# Patient Record
Sex: Female | Born: 1985 | Race: White | Hispanic: No | Marital: Single | Smoking: Never smoker
Health system: Southern US, Community
[De-identification: ages and names within clinical notes are randomized; demographics above are authoritative.]

## PROBLEM LIST (undated history)

## (undated) DIAGNOSIS — J45909 Unspecified asthma, uncomplicated: Secondary | ICD-10-CM

## (undated) DIAGNOSIS — E119 Type 2 diabetes mellitus without complications: Secondary | ICD-10-CM

## (undated) HISTORY — PX: ANKLE FRACTURE SURGERY: SHX122

---
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
## 2004-03-11 ENCOUNTER — Emergency Department: Payer: Self-pay | Admitting: Emergency Medicine

## 2004-10-18 ENCOUNTER — Emergency Department: Payer: Self-pay | Admitting: Emergency Medicine

## 2004-12-14 ENCOUNTER — Emergency Department (HOSPITAL_COMMUNITY): Admission: EM | Admit: 2004-12-14 | Discharge: 2004-12-14 | Payer: Self-pay | Admitting: Emergency Medicine

## 2005-03-26 ENCOUNTER — Emergency Department: Payer: Self-pay | Admitting: General Practice

## 2005-08-08 ENCOUNTER — Emergency Department: Payer: Self-pay | Admitting: Emergency Medicine

## 2005-08-09 ENCOUNTER — Ambulatory Visit: Payer: Self-pay | Admitting: Emergency Medicine

## 2005-12-25 ENCOUNTER — Observation Stay: Payer: Self-pay | Admitting: Unknown Physician Specialty

## 2005-12-29 ENCOUNTER — Observation Stay (HOSPITAL_COMMUNITY): Admission: RE | Admit: 2005-12-29 | Discharge: 2005-12-30 | Payer: Self-pay | Admitting: Obstetrics & Gynecology

## 2006-03-08 ENCOUNTER — Other Ambulatory Visit: Payer: Self-pay

## 2006-03-08 ENCOUNTER — Emergency Department: Payer: Self-pay | Admitting: Emergency Medicine

## 2006-07-14 ENCOUNTER — Emergency Department: Payer: Self-pay | Admitting: Emergency Medicine

## 2006-11-01 ENCOUNTER — Emergency Department: Payer: Self-pay | Admitting: Emergency Medicine

## 2007-12-28 ENCOUNTER — Observation Stay: Payer: Self-pay | Admitting: Specialist

## 2007-12-30 ENCOUNTER — Other Ambulatory Visit: Payer: Self-pay

## 2007-12-31 ENCOUNTER — Inpatient Hospital Stay: Payer: Self-pay | Admitting: Specialist

## 2008-01-24 ENCOUNTER — Emergency Department: Payer: Self-pay | Admitting: Emergency Medicine

## 2008-01-26 ENCOUNTER — Emergency Department: Payer: Self-pay | Admitting: Emergency Medicine

## 2008-08-07 ENCOUNTER — Emergency Department: Payer: Self-pay | Admitting: Unknown Physician Specialty

## 2008-11-23 ENCOUNTER — Emergency Department: Payer: Self-pay | Admitting: Emergency Medicine

## 2008-11-26 ENCOUNTER — Emergency Department: Payer: Self-pay | Admitting: Emergency Medicine

## 2009-05-14 ENCOUNTER — Emergency Department: Payer: Self-pay | Admitting: Emergency Medicine

## 2010-02-09 ENCOUNTER — Emergency Department: Payer: Self-pay | Admitting: Emergency Medicine

## 2010-10-20 ENCOUNTER — Emergency Department: Payer: Self-pay | Admitting: Unknown Physician Specialty

## 2011-03-13 ENCOUNTER — Emergency Department: Payer: Self-pay | Admitting: Emergency Medicine

## 2011-03-16 ENCOUNTER — Emergency Department: Payer: Self-pay | Admitting: Unknown Physician Specialty

## 2011-07-27 ENCOUNTER — Observation Stay: Payer: Self-pay

## 2011-07-27 ENCOUNTER — Emergency Department: Payer: Self-pay | Admitting: Emergency Medicine

## 2011-07-27 LAB — URINALYSIS, COMPLETE
Ketone: NEGATIVE
Nitrite: NEGATIVE
Ph: 6 (ref 4.5–8.0)
Protein: NEGATIVE
RBC,UR: 2 /HPF (ref 0–5)
Specific Gravity: 1.018 (ref 1.003–1.030)
Squamous Epithelial: 5

## 2011-08-29 ENCOUNTER — Observation Stay: Payer: Self-pay

## 2011-08-29 LAB — URINALYSIS, COMPLETE
Bacteria: NONE SEEN
Bilirubin,UR: NEGATIVE
Bilirubin,UR: NEGATIVE
Ketone: NEGATIVE
Leukocyte Esterase: NEGATIVE
Nitrite: NEGATIVE
Ph: 6 (ref 4.5–8.0)
Protein: NEGATIVE
Protein: NEGATIVE
Specific Gravity: 1.01 (ref 1.003–1.030)
Specific Gravity: 1.023 (ref 1.003–1.030)
Squamous Epithelial: 1
WBC UR: 3 /HPF (ref 0–5)
WBC UR: <1 /HPF (ref 0–5)

## 2011-09-14 ENCOUNTER — Observation Stay: Payer: Self-pay

## 2011-09-14 LAB — URINALYSIS, COMPLETE
Bilirubin,UR: NEGATIVE
Blood: NEGATIVE
Nitrite: NEGATIVE
Protein: NEGATIVE
Squamous Epithelial: NONE SEEN

## 2012-11-11 ENCOUNTER — Emergency Department: Payer: Self-pay | Admitting: Emergency Medicine

## 2012-11-14 ENCOUNTER — Emergency Department: Payer: Self-pay | Admitting: Unknown Physician Specialty

## 2013-01-18 ENCOUNTER — Emergency Department: Payer: Self-pay | Admitting: Internal Medicine

## 2014-05-11 ENCOUNTER — Emergency Department: Payer: Self-pay | Admitting: Emergency Medicine

## 2014-05-15 ENCOUNTER — Inpatient Hospital Stay: Payer: Self-pay | Admitting: Specialist

## 2014-07-22 NOTE — Op Note (Signed)
PATIENT NAME:  Bianca Alvarez, [name] MR#:  [id number] DATE OF BIRTH:  1985/04/24  DATE OF PROCEDURE:  05/15/2014  PREOPERATIVE DIAGNOSIS:  Displaced unstable trimalleolar fracture-dislocation of right ankle.   POSTOPERATIVE DIAGNOSIS:  Displaced unstable trimalleolar fracture-dislocation of right ankle.   OPERATION: Open reduction internal fixation of right ankle all 3 components.   SURGEON: Valinda HoarHoward E. Willadean Guyton, M.D.   ANESTHESIA: General endotracheal.   COMPLICATIONS: None.   DRAINS: None.   ESTIMATED BLOOD LOSS: Minimal.   REPLACEMENTS: None.   DESCRIPTION OF PROCEDURE: The patient was brought to the operating room where she underwent satisfactory general endotracheal anesthesia in the supine position. The right foot and leg were prepped and draped sterilely. An Esmarch bandage was applied to the lower right leg and the tourniquet inflated to 350 mmHg. Tourniquet time was 116 minutes. Fibular fracture was short oblique high fracture and a long longitudinal posterolateral incision was made to avoid the superficial peroneal nerve. Dissection was carried out bluntly through subcutaneous tissue and perineal fascia was released. The fibular shaft was exposed and freed up with a periosteal elevator. The fracture was reduced manually. The distal portion of the fibula was satisfactorily brought flat surface laterally, but just before the fracture site and proximal fractures to the fracture site, the lateral border was a bayonet in appearance and would not accept the plate. Therefore, the plate had to be applied posteriorly. An 8 hole, small fragment locking plate was chosen and applied to the posterior aspect of the fibula while the fracture was reduced. It was held with bone-holding forceps. Fluoroscopy showed the plate and the fracture to be in good position. The plate was then fixed with fibula with 8 cortical screws. Fluoroscopy showed these screws and the fracture to be satisfactory in appearance. The  ankle mortise was well reduced following this. Stressing the ankle under fluoroscopy did not reveal any laxity, which would provide the need for syndesmotic screw over the plate row. This wound was irrigated and the subcutaneous tissue closed with 2-0 Vicryl, and the skin closed with staples. A gently curved medial incision was made over the medial malleolus and dissection carried out bluntly through subcutaneous tissue down to the fracture site. Hematoma was evacuated and the joint was again thoroughly irrigated. This is a large fragment which reduced well and was held in place with a Sweetheart clamp. The tibial surface appeared to be intact. The medial malleolus was then fixed with 2 K wires and a 50 mm and 35 mm 4.0 cannulated screw were introduced to hold the fracture in good position alignment. Fluoroscopy again showed the hardware and the fracture to be well reduced. Preoperative x-ray there appeared to be a large posterolateral fragment off the back of the tibia. It was not well visualized during the procedure but I made an anterior stab wound anterolaterally over the distal tibia and with soft tissue protection, placed a pin from anterior to posterior. This was drilled and a 4.0 cannulated screw was introduced from anterior to posterior to fixate this posterolateral fragment. Final fluoroscopy showed all hardware to be in good position and the joint was in excellent position. The stress under fluoroscopy did not reveal any tendency for the joint to sublux. Medial wounds were irrigated and closed with 3-0 Vicryl and staples; as was the anterior incision. The wounds were all infiltrated with 0.5 Marcaine. Dry sterile dressings and cast padding and a posterior splint were applied. The tourniquet was deflated with slow but eventually excellent circulation to the toes.  The patient was awakened and taken to recovery in good condition.    ____________________________ Valinda Hoar, MD hem:mc D: 05/15/2014  21:28:52 ET T: 05/16/2014 07:59:23 ET JOB#: [id number]  cc: Valinda Hoar, MD, <Dictator> Valinda Hoar MD ELECTRONICALLY SIGNED 05/21/2014 15:30

## 2014-07-22 NOTE — Discharge Summary (Signed)
PATIENT NAME:  Bianca Alvarez, Bianca Alvarez MR#:  [id number] DATE OF BIRTH:  [birth year]  DATE OF ADMISSION:  05/15/2014 DATE OF DISCHARGE: 05/17/2014  FINAL DIAGNOSES: Displaced trimalleolar fracture-dislocation of right ankle, brittle juvenile diabetes, morbid obesity, asthma, personality disorder.   OPERATIONS: On 05/15/2014, open reduction internal fixation of right ankle medially and laterally and anterior and posterior.   COMPLICATIONS: None.   CONSULTATION: [facility].   DISCHARGE MEDICATIONS:  Clonazepam 2 mg at bedtime, Lovenox 40 mg b.i.d., Lexapro 10 mg at bedtime, Neurontin 400 mg b.i.d., sliding-scale insulin, Mobic 7.5 mg daily, Pen VK 250 mg every 6 hours for 1 week,  Seroquel XR 800 mg at bedtime, Lantus insulin 72 units at bedtime, OxyContin ER 10 mg every 12 hours, Percocet 10/325 mg 1 every 4 hours p.r.n. moderate pain.    HISTORY OF PRESENT ILLNESS: The patient is a 29 year old by brittle insulin-dependent diabetic, who slipped in the mud on 05/15/2014 and fractured her right ankle. She was brought to the Emergency Room, where exam and x-rays revealed a completely displaced unstable trimalleolar fracture-dislocation of the right ankle. No other injuries were noted. The fracture was reduced in the Emergency Room and splinted. She was taken to surgery later that day.   PAST MEDICAL HISTOR: Illnesses as above.   MEDICATIONS: As above.   ALLERGIES: NONE.   REVIEW OF SYSTEMS: Unremarkable.   FAMILY HISTORY: Unremarkable.   SOCIAL HISTORY: The patient is not employed, lives by herself.   PHYSICAL EXAMINATION: VITAL SIGNS: Normal. GENERAL: Alert, but histrionic female in moderate distress. She was more comfortable following reduction and splinting of the fracture. The right ankle showed lateral deformity. Neurovascular status was satisfactory. The skin was intact and not tender. It was muddy and had to be cleansed carefully.   LABORATORY DATA: On admission was  satisfactory, except for elevated blood glucose.   HOSPITAL COURSE: The patient was taken to surgery the day of injury and underwent open reduction internal fixation of the right ankle. Postoperatively, she did satisfactorily, except for excessive complaints of pain. She was treated with high doses of narcotics to control her perceived pain. Neurovascular status was excellent. On 05/17/2014, the dressings were changed. The wounds were benign. A short leg cast was applied. She is being discharged to a skilled nursing rehab facility and is to be nonweightbearing on the right leg. Her recovery will be prolonged. She is at high risk of nonunion because of her juvenile diabetes. This has been explained to the patient and her family. She will return to the office in 10 days for exam and x-rays and staple removal of the right ankle.   ____________________________ Valinda HoarHoward E. Seanmichael Salmons, MD hem:ap D: 05/17/2014 13:25:53 ET T: 05/17/2014 13:41:03 ET JOB#: [id number]  cc: Valinda HoarHoward E. Jailine Lieder, MD, <Dictator> Valinda HoarHOWARD E Alysson Geist MD ELECTRONICALLY SIGNED 05/21/2014 15:30

## 2014-07-22 NOTE — H&P (Signed)
Subjective/Chief Complaint Pain right ankle   History of Present Illness 29 year old diabetic, obese female slipped in the mud today and injured the right ankle.  Brought to Emergency Room with obvious deformity of the right ankle and mud on lower legs.  Exam and X-rays show a severely displaced right trimalleolar ankle fracture. This was reduced by myself, skin cleansed, and splinted.  She is being admitted for surgery later today as she ate earlier this AM.  She had a positive strep test 05/11/14 and has been on Pen VK 250 mg.  She will be seen by Dr Earleen Newport of the [facility] service for diabetic control.  Risks and benefits of surgery were discussed at length including but not limited to infection, non union, nerve or blood vessed damage, non union, need for repeat surgery, blood clots and lung emboli, and death. Patient says she has no family nearby and will need skilled nursing facility placement.   Exam:  Alert in mild distress.  Mud over both lower legs/ankles.  Right ankle severely deformed but skin is in good shape without tenting.  Sensation and circulation intact.  No other injuries noted.  No other complaints of pain.    X-rays: as above  Rx: open reduction and internal fixation right ankle later today.   Past Medical Health Diabetes Mellitus   Code Status Full Code   Past Med/Surgical Hx:  Diabetes:   Asthma:   Denies surgical history.:   ALLERGIES:  No Known Allergies:   HOME MEDICATIONS:  Medication Instructions Status  ondansetron 4 mg oral tablet 1 tab(s) orally 3 times a day, As Needed - for Nausea, Vomiting Active  ibuprofen 400 mg oral tablet 1 tab(s) orally every 4 hours, As Needed - for Pain Active  penicillin V potassium 250 mg oral tablet 1  orally every 6 hours Active  Lexapro 5 mg oral tablet 2 tab(s) orally once a day (at bedtime) Active  KlonoPIN 2 mg oral tablet 1 tab(s) orally once a day (at bedtime) Active  SEROquel XR 400 mg oral tablet, extended release 2  tab(s) orally once a day (in the evening) Active  Lantus 100 units/mL subcutaneous solution 72 unit(s) subcutaneous once a day (at bedtime) Active   Family and Social History:  Family History Non-Contributory    Social History negative tobacco, negative ETOH   Place of Living Home    Review of Systems:  Fever/Chills No    Cough No    Sputum No    Abdominal Pain No    Diarrhea No    Constipation No    Physical Exam:  GEN well developed, well nourished, obese   HEENT pink conjunctivae   NECK supple    RESP normal resp effort    CARD regular rate    ABD denies tenderness  soft    LYMPH negative neck   EXTR negative edema, as above   SKIN normal to palpation   NEURO motor/sensory function intact   PSYCH alert, A+O to time, place, person   Lab Results:  Hepatic:  23-Feb-16 13:12   Bilirubin, Total 0.2  Alkaline Phosphatase 84  SGPT (ALT) 18  SGOT (AST) 31  Total Protein, Serum 7.1  Albumin, Serum  3.1  Routine Chem:  23-Feb-16 13:12   Glucose, Serum  261  BUN  6  Creatinine (comp) 0.74  Sodium, Serum 139  Potassium, Serum 4.4  Chloride, Serum 104  CO2, Serum 25  Calcium (Total), Serum  8.1  Osmolality (calc)  284  eGFR (African American) >60  eGFR (Non-African American) >60 (eGFR values <7m/min/1.73 m2 may be an indication of chronic kidney disease (CKD). Calculated eGFR, using the MRDR Study equation, is useful in  patients with stable renal function. The eGFR calculation will not be reliable in acutely ill patients when serum creatinine is changing rapidly. It is not useful in patients on dialysis. The eGFR calculation may not be applicable to patients at the low and high extremes of body sizes, pregnant women, and vegetarians.)  Result Comment POTASSIUM/CREATININE/BUN - Slight hemolysis, interpret results with AST/TOTAL PROTEIN - caution.  Result(s) reported on 15 May 2014 at 01:45PM.  Anion Gap 10  Routine Hem:  23-Feb-16 13:12    WBC (CBC)  14.9  RBC (CBC) 4.89  Hemoglobin (CBC) 13.1  Hematocrit (CBC) 41.4  Platelet Count (CBC) 313 (Result(s) reported on 15 May 2014 at 01:31PM.)  MCV 85  MCH 26.7  MCHC  31.6  RDW  15.0   Radiology Results:  XRay:    23-Feb-16 12:13, Ankle Right Complete  Ankle Right Complete  REASON FOR EXAM:    fall  COMMENTS:       PROCEDURE: DXR - DXR ANKLE RIGHT COMPLETE  - May 15 2014 12:13PM     CLINICAL DATA:  Patient fell into hole    EXAM:  RIGHT ANKLE - COMPLETE 3+ VIEW    COMPARISON:  None.    FINDINGS:  Frontal, oblique, and lateral views were obtained. There is gross  disruption at the ankle mortise with the foot displaced laterally  with respect to the tibial plafond. There is an obliquely oriented  fracture of the medial malleolus with medial malleolus position  inferior to the tibial plafond. There is a fracture of the posterior  medial aspect of the tibia, displaced laterally. There is a  comminuted obliquely oriented fracture of the distal fibular  diaphysis with lateral displacement and angulation distally.     IMPRESSION:  Complex fracture with gross mortise disruption. Specifically, the  medial malleolus is fractured and located inferior to the tibial  plafond. There is a fracture of the posteromedial tibia with the  distal fragment displaced and angulatedlaterally. Fracture of the  distal fibular diaphysis is displaced laterally and angled  laterally.  Electronically Signed    By: WLowella GripIII M.D.    On: 05/15/2014 12:38         Verified By: WLeafy Kindle WOODRUFF, M.D.,  LCoronita  PACS Image    Assessment/Admission Diagnosis Displaced right trimalleolar ankle fracture   Plan open reduction and internal fixation right ankle fracture   Electronic Signatures: MPark Breed(MD)  (Signed 23-Feb-16 14:10)  Authored: CHIEF COMPLAINT and HISTORY, PAST MEDICAL/SURGIAL HISTORY, ALLERGIES, HOME MEDICATIONS, FAMILY AND SOCIAL HISTORY,  REVIEW OF SYSTEMS, PHYSICAL EXAM, LABS, Radiology, ASSESSMENT AND PLAN   Last Updated: 23-Feb-16 14:10 by MPark Breed(MD)

## 2014-07-22 NOTE — Consult Note (Signed)
PATIENT NAME:  Bianca Alvarez, Deloria L MR#:  [id number] DATE OF BIRTH:  02-Sep-1985  DATE OF CONSULTATION:  05/15/2014  CONSULTING PHYSICIAN:  Herschell Dimesichard J. Renae GlossWieting, MD  PRIMARY CARE PHYSICIAN: Darreld McleanLinda Miles, MD  CONSULTED REQUESTED:  Deeann SaintHoward Miller, MD   CHIEF COMPLAINT: Right ankle pain.   HISTORY OF PRESENT ILLNESS: This is a 29 year old female who was in severe pain when I walked into the room, screaming in pain. Dr. Hyacinth MeekerMiller had to come back into the room to adjust her ankle. The patient was feeling a little bit better after that adjustment. She was very short with her words. She had a fall going into the house. Pain is 10/10 in intensity. She was dizzy and sweating at this point.  She has a history of diabetes. In the ER, she was found to have a complex fracture with gross mortise disruption. Dr. Hyacinth MeekerMiller will take her to the Operating Room later this evening. Medical consultation was obtained for diabetes management.   PAST MEDICAL HISTORY: Diabetes, obesity, asthma, and anxiety.   PAST SURGICAL HISTORY: None.   ALLERGIES: No known drug allergies.   MEDICATIONS: As per prescription writer include ibuprofen 400 mg every 4 hours as needed for pain, Klonopin 2 mg at bedtime, Lantus 72 units subcutaneous injection at bedtime, Lexapro 5 mg 2 tablets once a day at bedtime, Zofran 4 mg 3 times a day as needed for nausea and vomiting, penicillin VK 1 tablet every 6 hours 250 mg, Seroquel XR 400 mg 2 tablets in the evening, Tradjenta 5 mg 1 tablet daily.   SOCIAL HISTORY: No smoking. No alcohol. No drug use. Works as a Dispensing opticianbabysitter.   FAMILY HISTORY: The patient was in severe pain at the time and says she does not know anything about her family history.  REVIEW OF SYSTEMS: CONSTITUTIONAL: Positive for chills. No fever or sweats. No weight loss. No weight gain. No weakness or fatigue.  EYES: She does wear glasses.  EARS, NOSE, MOUTH AND THROAT: Positive for sore throat. Recently diagnosed with strep throat.   CARDIOVASCULAR: No chest pain. No palpitations.  RESPIRATORY: No shortness of breath. No cough. No sputum. No hemoptysis.  GASTROINTESTINAL: Positive for nausea. No vomiting. No abdominal pain. No diarrhea. No constipation. No bright red blood per rectum. No melena.  GENITOURINARY: No burning on urination or hematuria.  MUSCULOSKELETAL: Positive for the severe pain in the right ankle.  INTEGUMENT: No rashes or eruptions.  NEUROLOGIC: No fainting or blackouts.  PSYCHIATRIC: Positive for anxiety.  ENDOCRINE: No thyroid problems.  HEMATOLOGIC AND LYMPHATIC: No anemia, no easy bruising or bleeding.   PHYSICAL EXAMINATION: VITAL SIGNS: Most recent vital signs included a temperature of 97.8, pulse 77, respirations 20, blood pressure 141/99, pulse oximetry 99% on room air.  GENERAL: No respiratory distress.  EYES: Conjunctivae and lids normal. Pupils equal, round, and reactive to light. Extraocular muscles intact. No nystagmus.  EARS, NOSE, MOUTH AND THROAT: Tympanic membranes, no erythema. Nasal mucosa: No erythema.  THROAT: Slight erythema, no exudate seen.  LIPS AND GUMS: No lesions.  NECK: No JVD. No bruits. No lymphadenopathy. No thyromegaly. No thyroid nodules palpated.  LUNGS: Clear to auscultation. No use of accessory muscles to breathe. No rhonchi, rales, or wheeze heard.  CARDIOVASCULAR: S1, S2 normal. No gallops, rubs, or murmurs heard. Carotid upstrokes 2+ bilaterally. No bruits.  EXTREMITIES: Dorsalis pedis pulses unable to palpate on the right leg secondary to a splint put on by Dr. Hyacinth MeekerMiller, 1+ on the left leg.  ABDOMEN: Soft and  nontender. No organomegaly/splenomegaly. Normoactive bowel sounds. No masses felt.  LYMPHATIC: No lymph nodes in the neck.  MUSCULOSKELETAL: No clubbing, edema, or cyanosis.   SKIN: No ulcers or lesions.  Right leg covered with splint.  PSYCHIATRIC: The patient is oriented to person, place, and time.  NEUROLOGIC: Cranial nerves II-XII grossly intact. The  patient does have feeling in her toes on the right foot.   LABORATORY AND RADIOLOGICAL DATA: PT and INR normal range. Glucose 261, BUN 6, creatinine 0.74. Sodium 139, potassium 4.4, chloride 104, CO2 of 25, calcium 8.1. Liver function tests normal except for albumin low at 3.1. White blood cell count 14.9, hemoglobin and hematocrit 15.1 and 41.4 and platelet count of 313,000. Ankle x-ray shows complex fracture with gross mortise disruption, medial malleolus is fractured and located inferior to the tibial plafond. Fracture of the posterior medial tibia with the distal fragment displaced and angulated laterally. Fracture of the distal fibula displaced laterally and angulated laterally.   EKG: Reviewed and shows normal sinus rhythm, low voltage, no acute ST-T wave changes.   ASSESSMENT AND PLAN:  1.  Preoperative consultation. The patient can proceed with surgery, low risk patient. Surgery must be done on weight-bearing bone.  2.  Diabetes. Sugar is high. She does take her Lantus in the evening. I will give sliding scale for right now. She is going to surgery this evening. I want to make sure that she is eating prior to restarting the Lantus.  3.  Obesity, body mass index 49.4, weight loss is definitely needed. 4.  Anxiety. Continue psychiatric medications.  5.  Recent diagnosis of strep throat. Continue Penicillin VK.  TIME SPENT ON CONSULTATION: 45 minutes.    ____________________________ Herschell Dimes. Renae Gloss, MD rjw:mc D: 05/15/2014 15:14:03 ET T: 05/15/2014 15:42:55 ET JOB#: [id number]  cc: Herschell Dimes. Renae Gloss, MD, <Dictator> Leanna Sato, MD Valinda Hoar, MD Salley Scarlet MD ELECTRONICALLY SIGNED 05/15/2014 16:43

## 2014-07-31 NOTE — H&P (Signed)
[facility] Evaluation:  History:   HPI 29 year old G2P0101 with EDC=11/20/2011 presents to [facility] at 30 3/7 weeks with c/o no fetal movement 6/21 and pain in RLQ since this Am. She denies dysuria, fever, VB, N/V/D. She has been having a mucoid discharge x 4 weeks and was treated for a monilial infection 2 weeks ago. Pain in RLQ was intermittent and now constant. Took Tylenol this AM without relief . Denies any relieving factors. Has not drunk or eaten anything this AM. [facility] by Insulin dependent Type 2 diabetes. Prior to pregnancy she was not on Insulin, she was diagnosed with diabetes after her last pregnancy. Recent Hmg A1C was 5.9%. She had PPROM at 26 weeks and PTD at 27 weeks with her first pregnancy and is on 17P with this pregnancy and received US for cx length q2 weeks. until 28 weeks. She has depression and is currently taking Wellbutrin and Klonopin.She is morbidly obese with BMI 46.9 (>300#)and has asthma.She also has a hx of MRSA about 2 years ago.    Presents with abdominal pain, decreased fetal movement    Patient's Medical History Asthma  Diabetes  morbidly obese    Patient's Surgical History none    Medications Pre Natal Vitamins  22U Reg insulin and 44 U NPH in Am, 18U Reg insulin before dinner, and 16 U NPH at HS. Wellbutrin 450 mgm at hs, Klonopin 1mg m at hs, QVAR BID    Allergies NKDA    Social History none  Past hx of MJ and cocaine use.    Family History Non-Contributory   ROS:   ROS see HPI   Exam:   Vital Signs stable  115/70    Urine Protein negative dipstick, UA negative except for sugar>500    General WF in NAD    Mental Status clear    Abdomen gravid, obese, BS active, tenderness in  RLQ over lower uterine border    Estimated Fetal Weight ?    Fetal Position cephalic on US lying on maternal right.    Edema 1+    Reflexes 1+    Pelvic no external lesions, wet prep negative. scant white discharge. Cx closed internal os/25-50%/OOP     Mebranes Intact    FHT normal rate with no decels, 135 with accels to 150s, mod variability    FHT Description Cat 1    Fetal Heart Rate 135    Ucx absent    Other Heat applied to RLQ with some relief of discomfort.   Impression:   Impression IUP at 30 3/7 weeks with reactive NST. RLQ pain probably round ligament pain.   Plan:   Plan Instructed to do FKCs BID whilem lying down after eating. and report decreased FM. Heat/maternity support garment for round ligament pain. [facility]   Electronic Signatures: Bianca Alvarez, Bianca Alvarez (CNM)  (Signed 25-Jun-13 06:28)  Authored: [facility] Evaluation   Last Updated: 25-Jun-13 06:28 by Bianca Alvarez, Tonyetta Berko Alvarez (CNM)

## 2014-07-31 NOTE — H&P (Signed)
L&D Evaluation:  History:   HPI 29 year old [id number] with EDC=11/20/2011 presents to L&D at 28 weeks with c/o vaginal bleeding.Patient noted BRB on the toliet tissue when she wiped tonight at 1030. She has been having a mucoid discharge x 2 weeks and also noticed some vulvar irritation when wiping "like she had small cuts down there." Has noticed a little pressure and cramping. Baby active.PNC at [facility] complicated by Insulin dependent Type 2 diabetes. Prior to pregnancy she was not on Insulin, she was diagnosed with diabetes after her last pregnancy. She had PPROM at 26 weeks and PTD at 27 weeks with her first pregnancy and is on 17P with this pregnancy and receives US for cx length q2 weeks.. She has depression and is currently taking Wellbutrin and Klonopin.She is morbidly obese with BMI 46.9 (>300#)and has asthma.She also has a hx of MRSA about 2 years ago.    Presents with vaginal bleeding, (spotting)    Patient's Medical History Asthma  Diabetes  morbidly obese    Patient's Surgical History none    Medications Pre Natal Vitamins  22U Reg insulin and 44 U NPH in Am, 18U Reg insulin before dinner, and 16 U NPH at HS. Wellbutrin 450 mgm at hs, Klonopin 3mg m at hs, QVAR BID    Allergies NKDA    Social History none  Past hx of MJ and cocaine use.    Family History Non-Contributory   ROS:   ROS Had N/V x1 with diarrhea 2 days ago. Urinary frequency +.   Exam:   Vital Signs stable    Urine Protein negative dipstick, CCMS UA- 3+ blood, trace leuks, 13 RBC/HPF, 3 WBC/HPF, +1 bacteria, 3 epi cells, 1.023    General no apparent distress    Mental Status clear    Chest inspiratory wheezing right base    Heart normal sinus rhythm, no murmur/gallop/rubs    Abdomen gravid, non-tender    Fetal Position cephalic on US    Pelvic Vulva appears inflammed with one small fissure on left labia. no blood in vault seen or on glove after exam. wet prep negative for Trich, clue cells or  hyphae.  Increased WBCs/HPF. Cx long, posterior, closed internal os, OOP    Mebranes Intact    FHT normal rate with no decels, reactive tracing    Fetal Heart Rate 130    Ucx one cxt seen in 30 minutes    Other Placenta posterior, not low lying.   Impression:   Impression IUP at 28 weeks with vaginal spotting. Blood seen on UA.-may be a contaminate from vulvar irritation.Marland Kitchen. Possible monilia   Plan:   Plan Will treat empirically for monilia with Diflucan. Cath UA .  Continue to monitor for ctxs    Comments 0230-Cath UA totally negative. No further contractions noted. No further bleeding. Will dc home with PTL precautions. [facility] June or sooner for regular contractions, bleeding, decreased FM, or LOF.   Electronic Signatures: Trinna BalloonGutierrez, Shawne Bulow L (CNM)  (Signed 08-Jun-13 03:30)  Authored: L&D Evaluation   Last Updated: 08-Jun-13 03:30 by Trinna BalloonGutierrez, Desiderio Dolata L (CNM)

## 2014-07-31 NOTE — H&P (Signed)
L&D Evaluation:  History:   HPI 29 year old [id number] presents to L&D with c/o cramping and decreased FM. Pt is 24 4/7 with EDD 11/11/11. [facility] to have abscess looked at that is on her upper abdomen. When she came in she reported that she has dec FM and cramping so they brought her to L&D for eval. [facility] at [facility], do not have records but pt reports she is Insulin dependent Type 2 diabetic. Prior to pregnancy she was not on Insulin, she was diagnosed with diabetes after her last pregnancy. She is morbidly obese with BMI 46.9.  The abscess on her abd appeared about 1 week ago, she also has a hx of MRSA about 2 years ago.  She has history of PPROM and SVD at 28 weeks, that child is alive and doing well.    Presents with abdominal pain, decreased fetal movement, abdominal abscess    Patient's Medical History Asthma  Diabetes  morbidly obese    Patient's Surgical History none    Medications Pre Natal Vitamins  Insulin per [facility]    Allergies NKDA    Social History none    Family History Non-Contributory   ROS:   ROS see HPI   Exam:   Vital Signs stable    Urine Protein not completed    General no apparent distress    Mental Status clear    Abdomen gravid, non-tender, obese, 1 cm reddened/darkened and raised area in middle of upper abdomen    Back no CVAT    Edema no edema    Pelvic no external lesions, cervix closed and thick    Mebranes Intact    FHT normal rate with no decels, appropriate for 24 weeks    Ucx absent    Skin dry, other, abscess on upper abdomen   Impression:   Impression decreased fetal movement, cramping 24 weeks, abdominal abscess   Plan:   Plan UA, EFM/NST, other, send to ER for eval of abd abscess    Comments Pt is cleared obstetrically, no cervical dilation, FM heard when placing pt on monitor and pt states is feeling now. Informed pt that movment at 24 weeks is still inconsistent and she may not feel due to her size.  Will  send to ER for eval of abscess given pt hx of MRSA [facility].   Electronic Signatures: Shella Maximutnam, Keyira Mondesir (CNM)  (Signed 06-May-13 13:21)  Authored: L&D Evaluation   Last Updated: 06-May-13 13:21 by Shella MaximPutnam, Ruthene Methvin (CNM)

## 2015-01-05 ENCOUNTER — Encounter: Payer: Self-pay | Admitting: Emergency Medicine

## 2015-01-05 ENCOUNTER — Emergency Department
Admission: EM | Admit: 2015-01-05 | Discharge: 2015-01-05 | Disposition: A | Discharge status: Home / Self Care | Payer: Medicaid Other | Attending: Emergency Medicine | Admitting: Emergency Medicine

## 2015-01-05 ENCOUNTER — Emergency Department: Payer: Medicaid Other

## 2015-01-05 DIAGNOSIS — Y9289 Other specified places as the place of occurrence of the external cause: Secondary | ICD-10-CM | POA: Insufficient documentation

## 2015-01-05 DIAGNOSIS — Y9389 Activity, other specified: Secondary | ICD-10-CM | POA: Diagnosis not present

## 2015-01-05 DIAGNOSIS — Y998 Other external cause status: Secondary | ICD-10-CM | POA: Insufficient documentation

## 2015-01-05 DIAGNOSIS — Z3202 Encounter for pregnancy test, result negative: Secondary | ICD-10-CM | POA: Insufficient documentation

## 2015-01-05 DIAGNOSIS — E669 Obesity, unspecified: Secondary | ICD-10-CM | POA: Insufficient documentation

## 2015-01-05 DIAGNOSIS — S39012A Strain of muscle, fascia and tendon of lower back, initial encounter: Secondary | ICD-10-CM | POA: Diagnosis not present

## 2015-01-05 DIAGNOSIS — X58XXXA Exposure to other specified factors, initial encounter: Secondary | ICD-10-CM | POA: Insufficient documentation

## 2015-01-05 DIAGNOSIS — J020 Streptococcal pharyngitis: Secondary | ICD-10-CM | POA: Diagnosis not present

## 2015-01-05 DIAGNOSIS — S3992XA Unspecified injury of lower back, initial encounter: Secondary | ICD-10-CM | POA: Diagnosis present

## 2015-01-05 LAB — POCT PREGNANCY, URINE: Preg Test, Ur: NEGATIVE

## 2015-01-05 MED ORDER — TRAMADOL HCL 50 MG PO TABS: 50.0000 mg | ORAL_TABLET | Freq: Four times a day (QID) | ORAL | Status: DC | PRN

## 2015-01-05 MED ORDER — HYDROMORPHONE HCL 1 MG/ML IJ SOLN
1.0000 mg | Freq: Once | INTRAMUSCULAR | Status: AC
  Administered 2015-01-05: 1 mg via INTRAMUSCULAR
  Filled 2015-01-05: qty 1

## 2015-01-05 MED ORDER — IBUPROFEN 800 MG PO TABS: 800.0000 mg | ORAL_TABLET | Freq: Three times a day (TID) | ORAL | Status: DC | PRN

## 2015-01-05 MED ORDER — CYCLOBENZAPRINE HCL 10 MG PO TABS: 10.0000 mg | ORAL_TABLET | Freq: Three times a day (TID) | ORAL | Status: DC | PRN

## 2015-01-05 MED ORDER — MAGIC MOUTHWASH W/LIDOCAINE: 5.0000 mL | Freq: Four times a day (QID) | ORAL | Status: DC

## 2015-01-05 MED ORDER — ORPHENADRINE CITRATE 30 MG/ML IJ SOLN
60.0000 mg | Freq: Two times a day (BID) | INTRAMUSCULAR | Status: DC
  Administered 2015-01-05: 60 mg via INTRAMUSCULAR
  Filled 2015-01-05: qty 2

## 2015-01-05 MED ORDER — AMOXICILLIN 500 MG PO CAPS: 500.0000 mg | ORAL_CAPSULE | Freq: Three times a day (TID) | ORAL | Status: DC

## 2015-01-05 MED ORDER — KETOROLAC TROMETHAMINE 60 MG/2ML IM SOLN
60.0000 mg | Freq: Once | INTRAMUSCULAR | Status: AC
  Administered 2015-01-05: 60 mg via INTRAMUSCULAR
  Filled 2015-01-05: qty 2

## 2015-01-05 NOTE — ED Notes (Signed)
Reports bending over cleaning the fridge last pm and started having back pain

## 2015-01-05 NOTE — ED Notes (Signed)
NAD noted at time of D/C. Pt taken to the lobby via wheelchair at this time. Pt denies comments/questions at this time.

## 2015-01-05 NOTE — ED Provider Notes (Signed)
[facility] Emergency Department Provider Note  ____________________________________________  Time seen: Approximately 10:33 AM  I have reviewed the triage vital signs and the nursing notes.   HISTORY  Chief Complaint Back Pain    HPI Bianca Alvarez is a 29 y.o. female patient complaining of acute back pain secondary to prolonged flexion to release cleaning the refrigerator last night. Patient state the pain is localized to the lower center of her back. Patient denies any radicular component to this pain she denies any bladder or bowel dysfunction. Patient is rating her pain as a 10 over 10. No palliative measures taken for this complaint. Patient also complaining of 3 days of sore throat with increased pain with swallowing. Patient denies any fever or chills associated this complaint she denies any URI signs symptoms. Patient stated there is decreased forced volume secondary to sore throat. Patient is using over-the-counter medication for this complaint without relief.   History reviewed. No pertinent past medical history.  There are no active problems to display for this patient.   History reviewed. No pertinent past surgical history.  Current Outpatient Rx  Name  Route  Sig  Dispense  Refill  . amoxicillin (AMOXIL) 500 MG capsule   Oral   Take 1 capsule (500 mg total) by mouth 3 (three) times daily.   30 capsule   0   . cyclobenzaprine (FLEXERIL) 10 MG tablet   Oral   Take 1 tablet (10 mg total) by mouth every 8 (eight) hours as needed for muscle spasms.   15 tablet   0   . ibuprofen (ADVIL,MOTRIN) 800 MG tablet   Oral   Take 1 tablet (800 mg total) by mouth every 8 (eight) hours as needed for moderate pain.   15 tablet   0   . magic mouthwash w/lidocaine SOLN   Oral   Take 5 mLs by mouth 4 (four) times daily.   100 mL   0   . traMADol (ULTRAM) 50 MG tablet   Oral   Take 1 tablet (50 mg total) by mouth every 6 (six) hours as needed  for moderate pain.   12 tablet   0     Allergies Review of patient's allergies indicates no known allergies.  History reviewed. No pertinent family history.  Social History Social History  Substance Use Topics  . Smoking status: Never Smoker   . Smokeless tobacco: None  . Alcohol Use: None    Review of Systems Constitutional: No fever/chills Eyes: No visual changes. ENT: Sore throat. Cardiovascular: Denies chest pain. Respiratory: Denies shortness of breath. Gastrointestinal: No abdominal pain.  No nausea, no vomiting.  No diarrhea.  No constipation. Genitourinary: Negative for dysuria. Musculoskeletal: Positive for back pain. Skin: Negative for rash. Neurological: Negative for headaches, focal weakness or numbness. Psychiatric: Endocrine:Obesity 10-point ROS otherwise negative.  ____________________________________________   PHYSICAL EXAM:  VITAL SIGNS: ED Triage Vitals  Enc Vitals Group     BP --      Pulse --      Resp --      Temp --      Temp src --      SpO2 --      Weight --      Height --      Head Cir --      Peak Flow --      Pain Score 01/05/15 1015 10     Pain Loc --      Pain Edu? --  Excl. in GC? --     Constitutional: Alert and oriented. Moderate distress. Eyes: Conjunctivae are normal. PERRL. EOMI. Head: Atraumatic. Nose: No congestion/rhinnorhea. Mouth/Throat: Mucous membranes are moist.  Oropharynx erythematous. Neck: No stridor.  No cervical spine tenderness to palpation. Hematological/Lymphatic/Immunilogical: No cervical lymphadenopathy. Cardiovascular: Normal rate, regular rhythm. Grossly normal heart sounds.  Good peripheral circulation. Respiratory: Normal respiratory effort.  No retractions. Lungs CTAB. Gastrointestinal: Soft and nontender. No distention. No abdominal bruits. No CVA tenderness. Musculoskeletal: No spinal deformity. Patient sits and stands with heavy reliance on upper extremities. Patient has some  moderate guarding with palpation L4-S1. Patient decreased range of motion's all fields limited by complaining of pain. Bilateral paraspinal muscle spasm with flexion. Patient Relates test. Neurologic:  Normal speech and language. No gross focal neurologic deficits are appreciated. No gait instability. Skin:  Skin is warm, dry and intact. No rash noted. Psychiatric: Mood and affect are normal. Speech and behavior are normal.  ____________________________________________   LABS (all labs ordered are listed, but only abnormal results are displayed)  Labs Reviewed  POCT PREGNANCY, URINE   ____________________________________________  EKG   ____________________________________________  RADIOLOGY  No acute findings on x-ray. I, Joni Reining, personally viewed and evaluated these images (plain radiographs) as part of my medical decision making.   ____________________________________________   PROCEDURES  Procedure(s) performed: None  Critical Care performed: No  ____________________________________________   INITIAL IMPRESSION / ASSESSMENT AND PLAN / ED COURSE  Pertinent labs & imaging results that were available during my care of the patient were reviewed by me and considered in my medical decision making (see chart for details).  Acute lumbar strain. Patient moderate improvement status post Dilaudid, Toradol, Norflex. Patient has a positive rapid strep test. Patient will be discharged prescription for tramadol, Flexeril, ibuprofen, and amoxicillin. Patient also given a prescription for Magic mouthwash. Advised patient [facility] ____________________________________________   FINAL CLINICAL IMPRESSION(S) / ED DIAGNOSES  Final diagnoses:  Lumbar strain, initial encounter  Pharyngitis, streptococcal, acute      Joni Reining, PA-C 01/05/15 1321  Phineas Semen, MD 01/05/15 [phone]

## 2015-01-09 LAB — POCT RAPID STREP A: STREPTOCOCCUS, GROUP A SCREEN (DIRECT): POSITIVE — AB

## 2015-06-02 ENCOUNTER — Emergency Department
Admission: EM | Admit: 2015-06-02 | Discharge: 2015-06-02 | Disposition: A | Discharge status: Home / Self Care | Payer: Medicaid Other | Attending: Emergency Medicine | Admitting: Emergency Medicine

## 2015-06-02 ENCOUNTER — Encounter: Payer: Self-pay | Admitting: Emergency Medicine

## 2015-06-02 DIAGNOSIS — R112 Nausea with vomiting, unspecified: Secondary | ICD-10-CM | POA: Diagnosis not present

## 2015-06-02 DIAGNOSIS — Z79899 Other long term (current) drug therapy: Secondary | ICD-10-CM | POA: Diagnosis not present

## 2015-06-02 DIAGNOSIS — Z3202 Encounter for pregnancy test, result negative: Secondary | ICD-10-CM | POA: Insufficient documentation

## 2015-06-02 DIAGNOSIS — E119 Type 2 diabetes mellitus without complications: Secondary | ICD-10-CM | POA: Diagnosis not present

## 2015-06-02 DIAGNOSIS — Z792 Long term (current) use of antibiotics: Secondary | ICD-10-CM | POA: Diagnosis not present

## 2015-06-02 DIAGNOSIS — Z794 Long term (current) use of insulin: Secondary | ICD-10-CM | POA: Diagnosis not present

## 2015-06-02 DIAGNOSIS — L03116 Cellulitis of left lower limb: Secondary | ICD-10-CM | POA: Diagnosis not present

## 2015-06-02 HISTORY — DX: Type 2 diabetes mellitus without complications: E11.9

## 2015-06-02 HISTORY — DX: Unspecified asthma, uncomplicated: J45.909

## 2015-06-02 LAB — POCT PREGNANCY, URINE: Preg Test, Ur: NEGATIVE

## 2015-06-02 MED ORDER — ONDANSETRON 4 MG PO TBDP: 4.0000 mg | ORAL_TABLET | Freq: Four times a day (QID) | ORAL | Status: DC | PRN

## 2015-06-02 MED ORDER — SULFAMETHOXAZOLE-TRIMETHOPRIM 800-160 MG PO TABS: 2.0000 | ORAL_TABLET | Freq: Two times a day (BID) | ORAL | Status: DC

## 2015-06-02 MED ORDER — HYDROCODONE-ACETAMINOPHEN 5-325 MG PO TABS: 1.0000 | ORAL_TABLET | Freq: Four times a day (QID) | ORAL | Status: DC | PRN

## 2015-06-02 MED ORDER — SULFAMETHOXAZOLE-TRIMETHOPRIM 800-160 MG PO TABS
1.0000 | ORAL_TABLET | Freq: Once | ORAL | Status: AC
  Administered 2015-06-02: 1 via ORAL
  Filled 2015-06-02: qty 1

## 2015-06-02 MED ORDER — ONDANSETRON 4 MG PO TBDP
4.0000 mg | ORAL_TABLET | Freq: Once | ORAL | Status: AC
  Administered 2015-06-02: 4 mg via ORAL
  Filled 2015-06-02: qty 1

## 2015-06-02 MED ORDER — OXYCODONE-ACETAMINOPHEN 5-325 MG PO TABS
1.0000 | ORAL_TABLET | Freq: Once | ORAL | Status: AC
  Administered 2015-06-02: 1 via ORAL
  Filled 2015-06-02: qty 1

## 2015-06-02 NOTE — ED Notes (Signed)
Pt c/o pain, redness, and swelling to inner left thigh x1 week, getting progressively worse. Pt has hx abscesses that she states usually resolve on their own but this one is not improving. States she has had one in the past that required I&D.

## 2015-06-02 NOTE — ED Notes (Addendum)
Pt states she would also like to have a pregnancy test. She does not want family members with her to know.

## 2015-06-02 NOTE — Discharge Instructions (Signed)
Use warm compresses, three times as day-as warm as comfortable-to aid in swelling. Take tylenol and ibuprofen for fevers. Take pain medication as needed for relief. Follow-up with your primary care in a week for re-assessment.  Return to the ED if signs of worsening infection.   Cellulitis Cellulitis is an infection of the skin and the tissue beneath it. The infected area is usually red and tender. Cellulitis occurs most often in the arms and lower legs.  CAUSES  Cellulitis is caused by bacteria that enter the skin through cracks or cuts in the skin. The most common types of bacteria that cause cellulitis are staphylococci and streptococci. SIGNS AND SYMPTOMS   Redness and warmth.  Swelling.  Tenderness or pain.  Fever. DIAGNOSIS  Your health care provider can usually determine what is wrong based on a physical exam. Blood tests may also be done. TREATMENT  Treatment usually involves taking an antibiotic medicine. HOME CARE INSTRUCTIONS   Take your antibiotic medicine as directed by your health care provider. Finish the antibiotic even if you start to feel better.  Keep the infected arm or leg elevated to reduce swelling.  Apply a warm cloth to the affected area up to 4 times per day to relieve pain.  Take medicines only as directed by your health care provider.  Keep all follow-up visits as directed by your health care provider. SEEK MEDICAL CARE IF:   You notice red streaks coming from the infected area.  Your red area gets larger or turns dark in color.  Your bone or joint underneath the infected area becomes painful after the skin has healed.  Your infection returns in the same area or another area.  You notice a swollen bump in the infected area.  You develop new symptoms.  You have a fever. SEEK IMMEDIATE MEDICAL CARE IF:   You feel very sleepy.  You develop vomiting or diarrhea.  You have a general ill feeling (malaise) with muscle aches and pains.     This information is not intended to replace advice given to you by your health care provider. Make sure you discuss any questions you have with your health care provider.   Document Released: 12/17/2004 Document Revised: 11/28/2014 Document Reviewed: 05/25/2011 Elsevier Interactive Patient Education [employer]2016 [employer]vier Inc.

## 2015-06-02 NOTE — ED Provider Notes (Signed)
[facility] Emergency Department Provider Note ____________________________________________  Time seen: 1602  I have reviewed the triage vital signs and the nursing notes.  HISTORY  Chief Complaint  Abscess   HPI Bianca Alvarez is a 30 y.o. female with an abscess on her left thigh 2 weeks. She associates this with nausea, multiple cases of emesis, fevers and chills. The past 3-4 days site of the abscess has enlarged and become exquisitely painful. She denies any spontaneous drainage on the lesion. She has tried warm compresses, Tylenol and ibuprofen with minimal relief. She is a type II diabetic with uncontrolled blood glucose levels. She reports previous episodes of skin infections, but it was a they have all primarily resolved without medical or surgical intervention. She rates her pain at 8/10 in triage.  Past Medical History  Diagnosis Date  . Asthma   . Diabetes mellitus without complication (HCC)     There are no active problems to display for this patient.   Past Surgical History  Procedure Laterality Date  . Ankle fracture surgery      Current Outpatient Rx  Name  Route  Sig  Dispense  Refill  . insulin glargine (LANTUS) 100 UNIT/ML injection   Subcutaneous   Inject into the skin at bedtime.         Marland Kitchen. amoxicillin (AMOXIL) 500 MG capsule   Oral   Take 1 capsule (500 mg total) by mouth 3 (three) times daily.   30 capsule   0   . cyclobenzaprine (FLEXERIL) 10 MG tablet   Oral   Take 1 tablet (10 mg total) by mouth every 8 (eight) hours as needed for muscle spasms.   15 tablet   0   . HYDROcodone-acetaminophen (NORCO) 5-325 MG tablet   Oral   Take 1 tablet by mouth every 6 (six) hours as needed for moderate pain.   15 tablet   0   . ibuprofen (ADVIL,MOTRIN) 800 MG tablet   Oral   Take 1 tablet (800 mg total) by mouth every 8 (eight) hours as needed for moderate pain.   15 tablet   0   . magic mouthwash w/lidocaine SOLN    Oral   Take 5 mLs by mouth 4 (four) times daily.   100 mL   0   . ondansetron (ZOFRAN ODT) 4 MG disintegrating tablet   Oral   Take 1 tablet (4 mg total) by mouth every 6 (six) hours as needed for nausea or vomiting.   20 tablet   0   . sulfamethoxazole-trimethoprim (BACTRIM DS,SEPTRA DS) 800-160 MG tablet   Oral   Take 2 tablets by mouth 2 (two) times daily.   40 tablet   0   . traMADol (ULTRAM) 50 MG tablet   Oral   Take 1 tablet (50 mg total) by mouth every 6 (six) hours as needed for moderate pain.   12 tablet   0     Allergies Review of patient's allergies indicates no known allergies.  History reviewed. No pertinent family history.  Social History Social History  Substance Use Topics  . Smoking status: Passive Smoke Exposure - Never Smoker  . Smokeless tobacco: None  . Alcohol Use: No    Review of Systems  Constitutional: Positive for fever. Cardiovascular: Negative for chest pain. Respiratory: Negative for shortness of breath. Gastrointestinal: Positive for nausea and vomiting. Negative for abdominal pain and diarrhea. Genitourinary: Negative for dysuria. Musculoskeletal: Negative for back pain. Skin: Positive for abscess on  left inner thigh. Negative for rash. Neurological: Negative for headaches, focal weakness or numbness. ____________________________________________  PHYSICAL EXAM:  VITAL SIGNS: ED Triage Vitals  Enc Vitals Group     BP 06/02/15 1529 134/93 mmHg     Pulse Rate 06/02/15 1529 72     Resp 06/02/15 1529 17     Temp 06/02/15 1529 97.9 F (36.6 C)     Temp Source 06/02/15 1529 Oral     SpO2 06/02/15 1529 100 %     Weight 06/02/15 1529 290 lb (131.543 kg)     Height 06/02/15 1529  (1.702 m)     Head Cir --      Peak Flow --      Pain Score 06/02/15 1533 8     Pain Loc --      Pain Edu? --      Excl. in GC? --     Constitutional: Alert and oriented. Well appearing and in no  distress. Hematological/Lymphatic/Immunological: No cervical lymphadenopathy. Cardiovascular: Normal rate, regular rhythm.  Respiratory: Normal respiratory effort. No wheezes/rales/rhonchi. Gastrointestinal: Soft and nontender. No distention. Musculoskeletal: Nontender with normal range of motion in all extremities.  Neurologic:  Normal gait without ataxia. Normal speech and language.  Skin:  Skin is warm, dry and intact. No rash noted. Non-weeping or nonpurulent superficial area of erythema, measuring about 10 mc in diameter. There is a is 2-3 cm central blister, with induration extending deep to the surface of the medial, proximal left thigh. Psychiatric: Mood and affect are normal. Patient exhibits appropriate insight and judgment. ____________________________________________   PROCEDURES  Zofran 4 mg ODT Roxicet 5--325 mg PO Bactrim DS 1 PO ____________________________________________  INITIAL IMPRESSION / ASSESSMENT AND PLAN / ED COURSE  Based on physical exam, patient has a local cellulitis to the medial left thigh to be managed with close follow-up by primary care physician or this ED. Decision was made not to I&D the area due to patient being a type II diabetic with inadequately controlled blood glucose levels and it appears that there is no pointing, fluctuant, focal abscess collection ready to be incised on current presentation. Patient is to be discharged with Bactrim DS, Zofran and Vicodin. Patient is to use warm compresses 3 times a day. Patient is to follow-up with primary care physician within a week, or if the area organizes to a soft abscess as discussed. She is advised is to return to the emergency room if any signs of worsening infection, the area is spreading, worsening fever, or chills. ____________________________________________  FINAL CLINICAL IMPRESSION(S) / ED DIAGNOSES  Final diagnoses:  Cellulitis of left thigh      Lissa Hoard, PA-C 06/02/15  1911  Jene Every, MD 06/03/15 [phone]

## 2015-06-02 NOTE — ED Notes (Signed)
Discussed discharge instructions, prescriptions, and follow-up care with patient. No questions or concerns at this time. Pt stable at discharge.  

## 2015-11-06 ENCOUNTER — Encounter: Payer: Self-pay | Admitting: Emergency Medicine

## 2015-11-06 ENCOUNTER — Emergency Department
Admission: EM | Admit: 2015-11-06 | Discharge: 2015-11-06 | Discharge status: Against Medical Advice | Payer: Medicaid Other | Attending: Emergency Medicine | Admitting: Emergency Medicine

## 2015-11-06 DIAGNOSIS — E1165 Type 2 diabetes mellitus with hyperglycemia: Secondary | ICD-10-CM | POA: Diagnosis not present

## 2015-11-06 DIAGNOSIS — J45909 Unspecified asthma, uncomplicated: Secondary | ICD-10-CM | POA: Diagnosis not present

## 2015-11-06 DIAGNOSIS — R110 Nausea: Secondary | ICD-10-CM | POA: Insufficient documentation

## 2015-11-06 DIAGNOSIS — R000 Tachycardia, unspecified: Secondary | ICD-10-CM | POA: Diagnosis present

## 2015-11-06 DIAGNOSIS — R739 Hyperglycemia, unspecified: Secondary | ICD-10-CM

## 2015-11-06 DIAGNOSIS — Z794 Long term (current) use of insulin: Secondary | ICD-10-CM | POA: Diagnosis not present

## 2015-11-06 DIAGNOSIS — Z7722 Contact with and (suspected) exposure to environmental tobacco smoke (acute) (chronic): Secondary | ICD-10-CM | POA: Diagnosis not present

## 2015-11-06 NOTE — ED Notes (Signed)
This RN went over to draw labs off pt's IV and before I could say anything pt stated, "Yea, I'm not staying." MD notified, went to bedside to talk with pt.

## 2015-11-06 NOTE — ED Provider Notes (Signed)
[facility] Emergency Department Provider Note  ____________________________________________  Time seen: Approximately 7:33 PM  I have reviewed the triage vital signs and the nursing notes.   HISTORY  Chief Complaint Tachycardia    HPI Bianca Alvarez is a 30 y.o. female presents with tachycardia. She also reports some nausea but no vomiting. She reports eating and drinking normally. No polyuria or polydipsia. No dizziness chest pain shortness of breath fever chills sweats. No abdominal pain.  She requests to be discharged immediately as her ride is here and she was to go home. She reports that no one else was home to take care of her child and so police came and her child is currently in the care of police all she is here being evaluated. She is not concerned about her health currently and just wants to be discharged.     Past Medical History:  Diagnosis Date  . Asthma   . Diabetes mellitus without complication (HCC)      There are no active problems to display for this patient.    Past Surgical History:  Procedure Laterality Date  . ANKLE FRACTURE SURGERY       Prior to Admission medications   Medication Sig Start Date End Date Taking? Authorizing Provider  amoxicillin (AMOXIL) 500 MG capsule Take 1 capsule (500 mg total) by mouth 3 (three) times daily. 01/05/15   Joni Reiningonald K Smith, PA-C  cyclobenzaprine (FLEXERIL) 10 MG tablet Take 1 tablet (10 mg total) by mouth every 8 (eight) hours as needed for muscle spasms. 01/05/15   Joni Reiningonald K Smith, PA-C  HYDROcodone-acetaminophen (NORCO) 5-325 MG tablet Take 1 tablet by mouth every 6 (six) hours as needed for moderate pain. 06/02/15   Jenise V Bacon Menshew, PA-C  ibuprofen (ADVIL,MOTRIN) 800 MG tablet Take 1 tablet (800 mg total) by mouth every 8 (eight) hours as needed for moderate pain. 01/05/15   Joni Reiningonald K Smith, PA-C  insulin glargine (LANTUS) 100 UNIT/ML injection Inject into the skin at bedtime.     Historical Provider, MD  magic mouthwash w/lidocaine SOLN Take 5 mLs by mouth 4 (four) times daily. 01/05/15   Joni Reiningonald K Smith, PA-C  ondansetron (ZOFRAN ODT) 4 MG disintegrating tablet Take 1 tablet (4 mg total) by mouth every 6 (six) hours as needed for nausea or vomiting. 06/02/15   Charlesetta IvoryJenise V Bacon Menshew, PA-C  sulfamethoxazole-trimethoprim (BACTRIM DS,SEPTRA DS) 800-160 MG tablet Take 2 tablets by mouth 2 (two) times daily. 06/02/15   Jenise V Bacon Menshew, PA-C  traMADol (ULTRAM) 50 MG tablet Take 1 tablet (50 mg total) by mouth every 6 (six) hours as needed for moderate pain. 01/05/15   Joni Reiningonald K Smith, PA-C     Allergies Review of patient's allergies indicates no known allergies.   History reviewed. No pertinent family history.  Social History Social History  Substance Use Topics  . Smoking status: Passive Smoke Exposure - Never Smoker  . Smokeless tobacco: Never Used  . Alcohol use No    Review of Systems  Constitutional:   No fever or chills.  ENT:   No sore throat. No rhinorrhea. Cardiovascular:   No chest pain.Positive tachycardia Respiratory:   No dyspnea or cough. Gastrointestinal:   Negative for abdominal pain, vomiting and diarrhea.  Genitourinary:   Negative for dysuria or difficulty urinating. Musculoskeletal:   Negative for focal pain or swelling Neurological:   Negative for headaches 10-point ROS otherwise negative.  ____________________________________________   PHYSICAL EXAM:  VITAL SIGNS: ED Triage  Vitals  Enc Vitals Group     BP 11/06/15 1849 119/79     Pulse Rate 11/06/15 1849 (!) 124     Resp 11/06/15 1849 16     Temp 11/06/15 1849 98.4 F (36.9 C)     Temp Source 11/06/15 1849 Oral     SpO2 11/06/15 1849 97 %     Weight 11/06/15 1850 300 lb (136.1 kg)     Height 11/06/15 1850 5\' 6"  (1.676 m)     Head Circumference --      Peak Flow --      Pain Score 11/06/15 1850 0     Pain Loc --      Pain Edu? --      Excl. in GC? --     Vital  signs reviewed, nursing assessments reviewed.   Constitutional:   Alert and oriented. Well appearing and in no distress. Eyes:   No scleral icterus. No conjunctival pallor. PERRL. EOMI.  No nystagmus. ENT   Head:   Normocephalic and atraumatic.   Nose:   No congestion/rhinnorhea. No septal hematoma   Mouth/Throat:   MMM, no pharyngeal erythema. No peritonsillar mass.    Neck:   No stridor. No SubQ emphysema. No meningismus. Hematological/Lymphatic/Immunilogical:   No cervical lymphadenopathy. Cardiovascular:   Tachycardia heart rate 110. Symmetric bilateral radial and DP pulses.  No murmurs.  Respiratory:   Normal respiratory effort without tachypnea nor retractions. Breath sounds are clear and equal bilaterally. No wheezes/rales/rhonchi. Gastrointestinal:   Soft and nontender. Non distended. There is no CVA tenderness.  No rebound, rigidity, or guarding. Genitourinary:   deferred Musculoskeletal:   Nontender with normal range of motion in all extremities. No joint effusions.  No lower extremity tenderness.  No edema. Neurologic:   Normal speech and language.  CN 2-10 normal. Motor grossly intact. No gross focal neurologic deficits are appreciated.  Skin:    Skin is warm, dry and intact. No rash noted.  No petechiae, purpura, or bullae.  ____________________________________________    LABS (pertinent positives/negatives) (all labs ordered are listed, but only abnormal results are displayed) Labs Reviewed - No data to display ____________________________________________   EKG  Interpreted by me Sinus tachycardia rate 118, normal axis intervals QRS ST segments and T waves  ____________________________________________    RADIOLOGY    ____________________________________________   PROCEDURES Procedures  ____________________________________________   INITIAL IMPRESSION / ASSESSMENT AND PLAN / ED COURSE  Pertinent labs & imaging results that were available  during my care of the patient were reviewed by me and considered in my medical decision making (see chart for details).  Patient well appearing no acute distress. He is tachycardic, concern for dehydration. Discussed with patient the risk of dehydration with her high blood sugars and even acidosis and possibly DKA. However, she refuses to stay. Intact mental status, no confusion or psychosis, no evidence of any harm herself or others. She has medical decision-making capacity. I offered to write her prescription for nausea medicine and encouraged her to stay hydrated and follow up with primary care. She left the ED before I could present discharge instructions for her.     Clinical Course   ____________________________________________   FINAL CLINICAL IMPRESSION(S) / ED DIAGNOSES  Final diagnoses:  Tachycardia  Hyperglycemia       Portions of this note were generated with dragon dictation software. Dictation errors may occur despite best attempts at proofreading.    Sharman CheekPhillip Cody Oliger, MD 11/06/15 [id number]

## 2015-11-06 NOTE — ED Triage Notes (Signed)
Pt arrived via EMS from home. Pt was sitting and all of a sudden felt her heart racing. When EMS arrived pt's heart rate was 150, on the way in pt's heart rate was anywhere between 130 and 165. All other vitals were WNL. Pt's FSBS 425, pt has hx of diabetes and takes lantus at night, did take it last night.

## 2015-11-06 NOTE — ED Notes (Signed)
Pt asked to use phone to call ride to make sure they were still out front. Then pt asked if she could go out front to talk to ride. This RN explained to pt that she could not go out front until she was discharged. Pt stated, "I'm just worried that they're going to leave." This RN explained that she needed to wait on her paperwork. Pt stated, "I don't even want my paperwork and I don't need that prescription. I'm fine." This RN stressed that she needed to wait for her papers and that it wouldn't be that much longer.

## 2015-11-06 NOTE — ED Notes (Signed)
Pt not on stretcher, was seen walking past nurses' station towards lobby. MD notified.

## 2016-01-15 ENCOUNTER — Emergency Department
Admission: EM | Admit: 2016-01-15 | Discharge: 2016-01-15 | Disposition: A | Discharge status: Home / Self Care | Payer: Medicaid Other | Attending: Emergency Medicine | Admitting: Emergency Medicine

## 2016-01-15 DIAGNOSIS — Z794 Long term (current) use of insulin: Secondary | ICD-10-CM | POA: Diagnosis not present

## 2016-01-15 DIAGNOSIS — L02415 Cutaneous abscess of right lower limb: Secondary | ICD-10-CM

## 2016-01-15 DIAGNOSIS — E119 Type 2 diabetes mellitus without complications: Secondary | ICD-10-CM | POA: Diagnosis not present

## 2016-01-15 DIAGNOSIS — J45909 Unspecified asthma, uncomplicated: Secondary | ICD-10-CM | POA: Insufficient documentation

## 2016-01-15 DIAGNOSIS — Z7722 Contact with and (suspected) exposure to environmental tobacco smoke (acute) (chronic): Secondary | ICD-10-CM | POA: Insufficient documentation

## 2016-01-15 DIAGNOSIS — Z791 Long term (current) use of non-steroidal anti-inflammatories (NSAID): Secondary | ICD-10-CM | POA: Insufficient documentation

## 2016-01-15 MED ORDER — OXYCODONE HCL 5 MG PO TABS
10.0000 mg | ORAL_TABLET | Freq: Once | ORAL | Status: AC
  Administered 2016-01-15: 10 mg via ORAL
  Filled 2016-01-15: qty 2

## 2016-01-15 MED ORDER — LIDOCAINE-EPINEPHRINE (PF) 1 %-1:200000 IJ SOLN
30.0000 mL | Freq: Once | INTRAMUSCULAR | Status: AC
  Administered 2016-01-15: 30 mL
  Filled 2016-01-15: qty 30

## 2016-01-15 MED ORDER — ONDANSETRON 4 MG PO TBDP
4.0000 mg | ORAL_TABLET | Freq: Once | ORAL | Status: AC
  Administered 2016-01-15: 4 mg via ORAL
  Filled 2016-01-15: qty 1

## 2016-01-15 MED ORDER — ONDANSETRON HCL 4 MG PO TABS: 4.0000 mg | ORAL_TABLET | Freq: Three times a day (TID) | ORAL | 0 refills | Status: DC | PRN

## 2016-01-15 MED ORDER — HYDROCODONE-ACETAMINOPHEN 5-325 MG PO TABS: 1.0000 | ORAL_TABLET | ORAL | 0 refills | Status: DC | PRN

## 2016-01-15 MED ORDER — SULFAMETHOXAZOLE-TRIMETHOPRIM 800-160 MG PO TABS: 1.0000 | ORAL_TABLET | Freq: Two times a day (BID) | ORAL | 0 refills | Status: DC

## 2016-01-15 NOTE — ED Triage Notes (Signed)
Pt c/o abscess to the right upper leg, denies any drainage..Marland Kitchen

## 2016-01-15 NOTE — Discharge Instructions (Signed)
If not improving over the next 2 days, leave the packing in and come back to the ER. If the drainage has stopped, the redness is gone, and the pain has improved, pull the packing out.

## 2016-01-15 NOTE — ED Provider Notes (Signed)
[facility] Emergency Department Provider Note  ____________________________________________  Time seen: Approximately 1:07 PM  I have reviewed the triage vital signs and the nursing notes.   HISTORY  Chief Complaint Abscess   HPI Bianca Alvarez is a 30 y.o. female who presents to the emergency department for evaluation of an abscess on her right thigh. Symptoms started several days ago. She has had a history of abscesses in the past. She states that she feels nauseated because it is so painful. She has tried to use Boil Ease without relief.  Past Medical History:  Diagnosis Date  . Asthma   . Diabetes mellitus without complication (HCC)     There are no active problems to display for this patient.   Past Surgical History:  Procedure Laterality Date  . ANKLE FRACTURE SURGERY      Prior to Admission medications   Medication Sig Start Date End Date Taking? Authorizing Provider  HYDROcodone-acetaminophen (NORCO/VICODIN) 5-325 MG tablet Take 1 tablet by mouth every 4 (four) hours as needed for moderate pain. 01/15/16 01/14/17  Chinita Pesterari B Quirino Kakos, FNP  ibuprofen (ADVIL,MOTRIN) 800 MG tablet Take 1 tablet (800 mg total) by mouth every 8 (eight) hours as needed for moderate pain. 01/05/15   Joni Reiningonald K Smith, PA-C  insulin glargine (LANTUS) 100 UNIT/ML injection Inject into the skin at bedtime.    Historical Provider, MD  magic mouthwash w/lidocaine SOLN Take 5 mLs by mouth 4 (four) times daily. 01/05/15   Joni Reiningonald K Smith, PA-C  ondansetron (ZOFRAN ODT) 4 MG disintegrating tablet Take 1 tablet (4 mg total) by mouth every 6 (six) hours as needed for nausea or vomiting. 06/02/15   Charlesetta IvoryJenise V Bacon Menshew, PA-C  ondansetron (ZOFRAN) 4 MG tablet Take 1 tablet (4 mg total) by mouth every 8 (eight) hours as needed for nausea or vomiting. 01/15/16   Chinita Pesterari B Liane Tribbey, FNP  sulfamethoxazole-trimethoprim (BACTRIM DS,SEPTRA DS) 800-160 MG tablet Take 1 tablet by mouth 2 (two)  times daily. 01/15/16   Chinita Pesterari B Henery Betzold, FNP    Allergies Review of patient's allergies indicates no known allergies.  No family history on file.  Social History Social History  Substance Use Topics  . Smoking status: Passive Smoke Exposure - Never Smoker  . Smokeless tobacco: Never Used  . Alcohol use No    Review of Systems  Constitutional: Negative for fever/chills Respiratory: Negative for shortness of breath. Musculoskeletal: Negative for pain. Skin: Positive for painful, raised boil Neurological: Negative for headaches, focal weakness or numbness. ____________________________________________   PHYSICAL EXAM:  VITAL SIGNS: ED Triage Vitals [01/15/16 1234]  Enc Vitals Group     BP 130/75     Pulse Rate 73     Resp 18     Temp 97.9 F (36.6 C)     Temp Source Oral     SpO2 96 %     Weight 290 lb (131.5 kg)     Height 5\' 7"  (1.702 m)     Head Circumference      Peak Flow      Pain Score 8     Pain Loc      Pain Edu?      Excl. in GC?      Constitutional: Alert and oriented. Well appearing and in no acute distress. Eyes: Conjunctivae are normal. EOMI. Nose: No congestion/rhinnorhea. Mouth/Throat: Mucous membranes are moist.   Neck: No stridor. Respiratory: Normal respiratory effort.  No retractions. Musculoskeletal: FROM throughout. Skin: 3cm raised, fluctuant lesion  with surrounding erythema  ____________________________________________   LABS (all labs ordered are listed, but only abnormal results are displayed)  Labs Reviewed - No data to display ____________________________________________  EKG   ____________________________________________  RADIOLOGY  Not indicated. ____________________________________________   PROCEDURES  Procedure(s) performed:  INCISION AND DRAINAGE Performed by: Kem Boroughs Consent: Verbal consent obtained. Risks and benefits: risks, benefits and alternatives were discussed Type: abscess  Body area:  Right inner thigh  Anesthesia: local infiltration  Incision was made with a scalpel.  Local anesthetic: lidocaine 1% with epinephrine  Anesthetic total: 4 ml  Complexity: complex Blunt dissection to break up loculations  Drainage: purulent  Drainage amount: Large   Packing material: 1/4 in iodoform gauze  Patient tolerance: Patient tolerated the procedure well with no immediate complications.    ____________________________________________   INITIAL IMPRESSION / ASSESSMENT AND PLAN / ED COURSE  Clinical Course    Pertinent labs & imaging results that were available during my care of the patient were reviewed by me and considered in my medical decision making (see chart for details).  She will be advised to take the Bactrim, Zofran and Norco as prescribed. She was also advised that if the drainage stops and the redness goes away that she can pull the packing in 2 days. She was advised that if the pain continues, the redness is still present, and there is still drainage to leave the packing in place and return to the emergency department for a wound check..  She was also advised to return to the emergency department for symptoms that change or worsen if unable to schedule an appointment.  ____________________________________________   FINAL CLINICAL IMPRESSION(S) / ED DIAGNOSES  Final diagnoses:  Abscess of right thigh    Discharge Medication List as of 01/15/2016  2:01 PM    START taking these medications   Details  ondansetron (ZOFRAN) 4 MG tablet Take 1 tablet (4 mg total) by mouth every 8 (eight) hours as needed for nausea or vomiting., Starting Wed 01/15/2016, Print        Note:  This document was prepared using Dragon voice recognition software and may include unintentional dictation errors.    Chinita Pester, FNP 01/15/16 1452    Sharman Cheek, MD 01/15/16 1515

## 2016-01-15 NOTE — ED Notes (Signed)
Right inner thigh abscess, no drainage. Red, swollen, painful.

## 2016-04-12 ENCOUNTER — Emergency Department
Admission: EM | Admit: 2016-04-12 | Discharge: 2016-04-13 | Disposition: A | Discharge status: Home / Self Care | Payer: Medicaid Other | Attending: Student in an Organized Health Care Education/Training Program | Admitting: Student in an Organized Health Care Education/Training Program

## 2016-04-12 ENCOUNTER — Encounter: Payer: Self-pay | Admitting: Emergency Medicine

## 2016-04-12 DIAGNOSIS — E119 Type 2 diabetes mellitus without complications: Secondary | ICD-10-CM | POA: Diagnosis not present

## 2016-04-12 DIAGNOSIS — Z7722 Contact with and (suspected) exposure to environmental tobacco smoke (acute) (chronic): Secondary | ICD-10-CM | POA: Diagnosis not present

## 2016-04-12 DIAGNOSIS — Z794 Long term (current) use of insulin: Secondary | ICD-10-CM | POA: Insufficient documentation

## 2016-04-12 DIAGNOSIS — F332 Major depressive disorder, recurrent severe without psychotic features: Secondary | ICD-10-CM | POA: Diagnosis not present

## 2016-04-12 DIAGNOSIS — R45851 Suicidal ideations: Secondary | ICD-10-CM

## 2016-04-12 DIAGNOSIS — J45909 Unspecified asthma, uncomplicated: Secondary | ICD-10-CM | POA: Diagnosis not present

## 2016-04-12 DIAGNOSIS — R000 Tachycardia, unspecified: Secondary | ICD-10-CM

## 2016-04-12 DIAGNOSIS — T43502A Poisoning by unspecified antipsychotics and neuroleptics, intentional self-harm, initial encounter: Secondary | ICD-10-CM | POA: Diagnosis not present

## 2016-04-12 DIAGNOSIS — Z79899 Other long term (current) drug therapy: Secondary | ICD-10-CM | POA: Insufficient documentation

## 2016-04-12 DIAGNOSIS — T50902A Poisoning by unspecified drugs, medicaments and biological substances, intentional self-harm, initial encounter: Secondary | ICD-10-CM

## 2016-04-12 DIAGNOSIS — F1010 Alcohol abuse, uncomplicated: Secondary | ICD-10-CM

## 2016-04-12 DIAGNOSIS — T43501A Poisoning by unspecified antipsychotics and neuroleptics, accidental (unintentional), initial encounter: Secondary | ICD-10-CM

## 2016-04-12 DIAGNOSIS — F1410 Cocaine abuse, uncomplicated: Secondary | ICD-10-CM

## 2016-04-12 LAB — BASIC METABOLIC PANEL
Anion gap: 5 (ref 5–15)
BUN: 12 mg/dL (ref 6–20)
CALCIUM: 8.2 mg/dL — AB (ref 8.9–10.3)
CO2: 25 mmol/L (ref 22–32)
CREATININE: 0.6 mg/dL (ref 0.44–1.00)
Chloride: 105 mmol/L (ref 101–111)
GFR calc non Af Amer: >60 mL/min (ref >60)
Glucose, Bld: 284 mg/dL — ABNORMAL HIGH (ref 65–99)
Potassium: 4 mmol/L (ref 3.5–5.1)
SODIUM: 135 mmol/L (ref 135–145)

## 2016-04-12 LAB — CBC WITH DIFFERENTIAL/PLATELET
BASOS ABS: 0 10*3/uL (ref 0–0.1)
BASOS PCT: 0 %
EOS ABS: 0 10*3/uL (ref 0–0.7)
Eosinophils Relative: 0 %
HCT: 37.2 % (ref 35.0–47.0)
HEMOGLOBIN: 12.4 g/dL (ref 12.0–16.0)
Lymphocytes Relative: 23 %
Lymphs Abs: 3.7 10*3/uL — ABNORMAL HIGH (ref 1.0–3.6)
MCH: 28.4 pg (ref 26.0–34.0)
MCHC: 33.2 g/dL (ref 32.0–36.0)
MCV: 85.6 fL (ref 80.0–100.0)
MONO ABS: 0.8 10*3/uL (ref 0.2–0.9)
MONOS PCT: 5 %
NEUTROS PCT: 72 %
Neutro Abs: 11.5 10*3/uL — ABNORMAL HIGH (ref 1.4–6.5)
Platelets: 296 10*3/uL (ref 150–440)
RBC: 4.35 MIL/uL (ref 3.80–5.20)
RDW: 14.8 % — AB (ref 11.5–14.5)
WBC: 16.1 10*3/uL — ABNORMAL HIGH (ref 3.6–11.0)

## 2016-04-12 LAB — URINE DRUG SCREEN, QUALITATIVE (ARMC ONLY)
Amphetamines, Ur Screen: NOT DETECTED
BARBITURATES, UR SCREEN: NOT DETECTED
BENZODIAZEPINE, UR SCRN: NOT DETECTED
CANNABINOID 50 NG, UR ~~LOC~~: POSITIVE — AB
COCAINE METABOLITE, UR ~~LOC~~: POSITIVE — AB
MDMA (Ecstasy)Ur Screen: NOT DETECTED
METHADONE SCREEN, URINE: NOT DETECTED
Opiate, Ur Screen: NOT DETECTED
Phencyclidine (PCP) Ur S: NOT DETECTED
TRICYCLIC, UR SCREEN: NOT DETECTED

## 2016-04-12 LAB — COMPREHENSIVE METABOLIC PANEL
ALBUMIN: 3.5 g/dL (ref 3.5–5.0)
ALT: 18 U/L (ref 14–54)
ANION GAP: 12 (ref 5–15)
AST: 26 U/L (ref 15–41)
Alkaline Phosphatase: 52 U/L (ref 38–126)
BUN: 9 mg/dL (ref 6–20)
CO2: 19 mmol/L — AB (ref 22–32)
Calcium: 8.7 mg/dL — ABNORMAL LOW (ref 8.9–10.3)
Chloride: 107 mmol/L (ref 101–111)
Creatinine, Ser: 0.63 mg/dL (ref 0.44–1.00)
GFR calc Af Amer: >60 mL/min (ref >60)
GFR calc non Af Amer: >60 mL/min (ref >60)
Glucose, Bld: 216 mg/dL — ABNORMAL HIGH (ref 65–99)
POTASSIUM: 3.8 mmol/L (ref 3.5–5.1)
SODIUM: 138 mmol/L (ref 135–145)
Total Bilirubin: 0.3 mg/dL (ref 0.3–1.2)
Total Protein: 7.4 g/dL (ref 6.5–8.1)

## 2016-04-12 LAB — GLUCOSE, CAPILLARY: GLUCOSE-CAPILLARY: 288 mg/dL — AB (ref 65–99)

## 2016-04-12 LAB — POCT PREGNANCY, URINE: PREG TEST UR: NEGATIVE

## 2016-04-12 LAB — ACETAMINOPHEN LEVEL: Acetaminophen (Tylenol), Serum: <10 ug/mL — ABNORMAL LOW (ref 10–30)

## 2016-04-12 LAB — SALICYLATE LEVEL: Salicylate Lvl: <7 mg/dL (ref 2.8–30.0)

## 2016-04-12 MED ORDER — SODIUM CHLORIDE 0.9 % IV BOLUS (SEPSIS)
1000.0000 mL | Freq: Once | INTRAVENOUS | Status: AC
  Administered 2016-04-12: 1000 mL via INTRAVENOUS

## 2016-04-12 MED ORDER — INSULIN GLARGINE 100 UNIT/ML ~~LOC~~ SOLN
45.0000 [IU] | Freq: Two times a day (BID) | SUBCUTANEOUS | Status: DC
  Administered 2016-04-13 (×2): 45 [IU] via SUBCUTANEOUS
  Filled 2016-04-12 (×5): qty 0.45

## 2016-04-12 NOTE — ED Notes (Signed)

## 2016-04-12 NOTE — ED Notes (Signed)
ED BHU PLACEMENT JUSTIFICATION Is the patient under IVC or is there intent for IVC: Yes.   Is the patient medically cleared: Yes.   Is there vacancy in the ED BHU: Yes.   Is the population mix appropriate for patient: Yes.   Is the patient awaiting placement in inpatient or outpatient setting: No. Has the patient had a psychiatric consult: Yes.   Survey of unit performed for contraband, proper placement and condition of furniture, tampering with fixtures in bathroom, shower, and each patient room: Yes.  ; Findings: NA APPEARANCE/BEHAVIOR calm, cooperative and adequate rapport can be established NEURO ASSESSMENT Orientation: time, place and person Hallucinations: No.None noted (Hallucinations) Speech: Normal Gait: normal RESPIRATORY ASSESSMENT Normal expansion.  Clear to auscultation.  No rales, rhonchi, or wheezing. CARDIOVASCULAR ASSESSMENT regular rate and rhythm, S1, S2 normal, no murmur, click, rub or gallop GASTROINTESTINAL ASSESSMENT soft, nontender, BS WNL, no r/g EXTREMITIES normal strength, tone, and muscle mass PLAN OF CARE Provide calm/safe environment. Vital signs assessed twice daily. ED BHU Assessment once each 12-hour shift. Collaborate with intake RN daily or as condition indicates. Assure the ED provider has rounded once each shift. Provide and encourage hygiene. Provide redirection as needed. Assess for escalating behavior; address immediately and inform ED provider.  Assess family dynamic and appropriateness for visitation as needed: Yes.  ; If necessary, describe findings: NA Educate the patient/family about BHU procedures/visitation: Yes.  ; If necessary, describe findings: NA  

## 2016-04-12 NOTE — BHH Counselor (Signed)
TTS attempted to assess pt - 1st attempt unsuccessful - pt states she was "too tired to answer any questions". Pt appeared to be somewhat irritable.   However, this writer was able to gather some information regarding pt's precipitating event. Pt states she lives home with her two depended children 53(32 yo & 32 yo). She reports her two children are currently with her stepmother Drema Halon(Kim Wilson: [phone]: [phone]).  This Clinical research associatewriter called the number provided above and it was confirmed that this individual has pt's children. She also requested pt's password ... This Clinical research associatewriter directed her to pt's nurse Isla Pence(Quad, Charity fundraiserN).

## 2016-04-12 NOTE — ED Notes (Signed)
Pt. To BHU from ED ambulatory without difficulty, to room  BHU 1. Report from April RN. Pt. Is alert and oriented, warm and dry in no distress. Pt. Denies SI, HI, and AVH. Pt. Calm and cooperative. Pt. Made aware of security cameras and Q15 minute rounds. Pt. Encouraged to let Nursing staff know of any concerns or needs.

## 2016-04-12 NOTE — ED Provider Notes (Signed)
[facility] Emergency Department Provider Note    First MD Initiated Contact with Patient 04/12/16 [phone]     (approximate)  I have reviewed the triage vital signs and the nursing notes.   HISTORY  Chief Complaint Drug Overdose    HPI Bianca Alvarez is a 31 y.o. female with reported history of depression presents after intentional overdose on 10 400 mg Seroquel pills roughly 2 hours prior to arrival. Patient states that she was having thoughts and wishes to kill herself. According to EMS bystanders called EMS after she admitted that she had overdosed. She denies any hallucinations. States that her primary concern at this point is her restless leg syndrome.  Should very agitated and unable to provide much history at this point.   Past Medical History:  Diagnosis Date  . Asthma   . Diabetes mellitus without complication (HCC)    History reviewed. No pertinent family history. Past Surgical History:  Procedure Laterality Date  . ANKLE FRACTURE SURGERY     There are no active problems to display for this patient.     Prior to Admission medications   Medication Sig Start Date End Date Taking? Authorizing Provider  HYDROcodone-acetaminophen (NORCO/VICODIN) 5-325 MG tablet Take 1 tablet by mouth every 4 (four) hours as needed for moderate pain. 01/15/16 01/14/17  Chinita Pester, FNP  ibuprofen (ADVIL,MOTRIN) 800 MG tablet Take 1 tablet (800 mg total) by mouth every 8 (eight) hours as needed for moderate pain. 01/05/15   Joni Reining, PA-C  insulin glargine (LANTUS) 100 UNIT/ML injection Inject into the skin at bedtime.    Historical Provider, MD  magic mouthwash w/lidocaine SOLN Take 5 mLs by mouth 4 (four) times daily. 01/05/15   Joni Reining, PA-C  ondansetron (ZOFRAN ODT) 4 MG disintegrating tablet Take 1 tablet (4 mg total) by mouth every 6 (six) hours as needed for nausea or vomiting. 06/02/15   Charlesetta Ivory Menshew, PA-C  ondansetron (ZOFRAN)  4 MG tablet Take 1 tablet (4 mg total) by mouth every 8 (eight) hours as needed for nausea or vomiting. 01/15/16   Chinita Pester, FNP  sulfamethoxazole-trimethoprim (BACTRIM DS,SEPTRA DS) 800-160 MG tablet Take 1 tablet by mouth 2 (two) times daily. 01/15/16   Chinita Pester, FNP    Allergies Patient has no known allergies.    Social History Social History  Substance Use Topics  . Smoking status: Passive Smoke Exposure - Never Smoker  . Smokeless tobacco: Never Used  . Alcohol use Yes    Review of Systems Patient denies headaches, rhinorrhea, blurry vision, numbness, shortness of breath, chest pain, edema, cough, abdominal pain, nausea, vomiting, diarrhea, dysuria, fevers, rashes or hallucinations unless otherwise stated above in HPI. ____________________________________________   PHYSICAL EXAM:  VITAL SIGNS: Vitals:   04/12/16 1503 04/12/16 1607  BP: 114/82 107/85  Pulse: (!) 103 90  Resp: 15 16  Temp:  97.9 F (36.6 C)    Constitutional: Alert, anvxious appearing, pacing about room. Eyes: Conjunctivae are normal. PERRL. EOMI. Head: Atraumatic. Nose: No congestion/rhinnorhea. Mouth/Throat: Mucous membranes are moist.  Oropharynx non-erythematous. Neck: No stridor. Painless ROM. No cervical spine tenderness to palpation Hematological/Lymphatic/Immunilogical: No cervical lymphadenopathy. Cardiovascular: Normal rate, regular rhythm. Grossly normal heart sounds.  Good peripheral circulation. Respiratory: Normal respiratory effort.  No retractions. Lungs CTAB. Gastrointestinal: Soft and nontender. No distention. No abdominal bruits. No CVA tenderness. Musculoskeletal: No lower extremity tenderness nor edema.  No joint effusions. Neurologic:  Normal speech and language.  No gross focal neurologic deficits are appreciated. No gait instability. Skin:  Skin is warm, dry and intact. No rash noted. Psychiatric: Mood and affect are normal. Speech and behavior are  normal.  ____________________________________________   LABS (all labs ordered are listed, but only abnormal results are displayed)  Results for orders placed or performed during the hospital encounter of 04/12/16 (from the past 24 hour(s))  Urine Drug Screen, Qualitative ([facility] only)     Status: Abnormal   Collection Time: 04/12/16  9:55 AM  Result Value Ref Range   Tricyclic, Ur Screen NONE DETECTED NONE DETECTED   Amphetamines, Ur Screen NONE DETECTED NONE DETECTED   MDMA (Ecstasy)Ur Screen NONE DETECTED NONE DETECTED   Cocaine Metabolite,Ur [place] POSITIVE (A) NONE DETECTED   Opiate, Ur Screen NONE DETECTED NONE DETECTED   Phencyclidine (PCP) Ur S NONE DETECTED NONE DETECTED   Cannabinoid 50 Ng, Ur [place] POSITIVE (A) NONE DETECTED   Barbiturates, Ur Screen NONE DETECTED NONE DETECTED   Benzodiazepine, Ur Scrn NONE DETECTED NONE DETECTED   Methadone Scn, Ur NONE DETECTED NONE DETECTED  CBC with Differential/Platelet     Status: Abnormal   Collection Time: 04/12/16 10:26 AM  Result Value Ref Range   WBC 16.1 (H) 3.6 - 11.0 K/uL   RBC 4.35 3.80 - 5.20 MIL/uL   Hemoglobin 12.4 12.0 - 16.0 g/dL   HCT 64.3 32.9 - 51.8 %   MCV 85.6 80.0 - 100.0 fL   MCH 28.4 26.0 - 34.0 pg   MCHC 33.2 32.0 - 36.0 g/dL   RDW 84.1 (H) 66.0 - 63.0 %   Platelets 296 150 - 440 K/uL   Neutrophils Relative % 72 %   Neutro Abs 11.5 (H) 1.4 - 6.5 K/uL   Lymphocytes Relative 23 %   Lymphs Abs 3.7 (H) 1.0 - 3.6 K/uL   Monocytes Relative 5 %   Monocytes Absolute 0.8 0.2 - 0.9 K/uL   Eosinophils Relative 0 %   Eosinophils Absolute 0.0 0 - 0.7 K/uL   Basophils Relative 0 %   Basophils Absolute 0.0 0 - 0.1 K/uL  Comprehensive metabolic panel     Status: Abnormal   Collection Time: 04/12/16 10:26 AM  Result Value Ref Range   Sodium 138 135 - 145 mmol/L   Potassium 3.8 3.5 - 5.1 mmol/L   Chloride 107 101 - 111 mmol/L   CO2 19 (L) 22 - 32 mmol/L   Glucose, Bld 216 (H) 65 - 99 mg/dL   BUN 9 6 - 20 mg/dL    Creatinine, Ser 1.60 0.44 - 1.00 mg/dL   Calcium 8.7 (L) 8.9 - 10.3 mg/dL   Total Protein 7.4 6.5 - 8.1 g/dL   Albumin 3.5 3.5 - 5.0 g/dL   AST 26 15 - 41 U/L   ALT 18 14 - 54 U/L   Alkaline Phosphatase 52 38 - 126 U/L   Total Bilirubin 0.3 0.3 - 1.2 mg/dL   GFR calc non Af Amer >60 >60 mL/min   GFR calc Af Amer >60 >60 mL/min   Anion gap 12 5 - 15  Acetaminophen level     Status: Abnormal   Collection Time: 04/12/16 10:26 AM  Result Value Ref Range   Acetaminophen (Tylenol), Serum <10 (L) 10 - 30 ug/mL  Salicylate level     Status: None   Collection Time: 04/12/16 10:26 AM  Result Value Ref Range   Salicylate Lvl <7.0 2.8 - 30.0 mg/dL  Pregnancy, urine POC  Status: None   Collection Time: 04/12/16 10:31 AM  Result Value Ref Range   Preg Test, Ur NEGATIVE NEGATIVE   ____________________________________________  EKG My review and personal interpretation at Time: 9:40   Indication: overdose  Rate: 140  Rhythm: sinus Axis: normal Other: normal intervals, no STEMI ____________________________________________  ____________________________________________   PROCEDURES  Procedure(s) performed:  Procedures    Critical Care performed: no ____________________________________________   INITIAL IMPRESSION / ASSESSMENT AND PLAN / ED COURSE  Pertinent labs & imaging results that were available during my care of the patient were reviewed by me and considered in my medical decision making (see chart for details).  DDX: Psychosis, delirium, medication effect, noncompliance, polysubstance abuse, Si, Hi, depression   Tobie LordsJackie L Alvarez is a 31 y.o. who presents to the ED with for evaluation of intentional overdose.  Patient has psych history of reported depression and substance abuse.  Patient cocaine+  EKG with normal intervals.  tox screen negative otherwise.  Patient observed for 6hrs with stable vitals and no somnolence or deterioration..  Laboratory testing was ordered to  evaluation for underlying electrolyte derangement or signs of underlying organic pathology to explain today's presentation.  Based on history and physical and laboratory evaluation, it appears that the patient's presentation is 2/2 underlying psychiatric disorder and will require further evaluation and management by inpatient psychiatry.  Patient was  made an IVC due to SI.  Disposition pending psychiatric evaluation.     ____________________________________________   FINAL CLINICAL IMPRESSION(S) / ED DIAGNOSES  Final diagnoses:  Intentional drug overdose, initial encounter (HCC)  Suicidal ideation  Tachycardia      NEW MEDICATIONS STARTED DURING THIS VISIT:  New Prescriptions   No medications on file     Note:  This document was prepared using Dragon voice recognition software and may include unintentional dictation errors.    Willy EddyPatrick Emberlee Sortino, MD 04/12/16 [id number]

## 2016-04-12 NOTE — ED Notes (Signed)
Report from amber, rn. Pt awake, declines needs at this time.
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
## 2016-04-12 NOTE — ED Triage Notes (Signed)
Pt ems from home for intentional overdose. Pt took 10 400mg  seroquil approx. 1 hour ago. Pt did not call ems.

## 2016-04-13 DIAGNOSIS — F332 Major depressive disorder, recurrent severe without psychotic features: Secondary | ICD-10-CM

## 2016-04-13 DIAGNOSIS — F1010 Alcohol abuse, uncomplicated: Secondary | ICD-10-CM

## 2016-04-13 DIAGNOSIS — T43501A Poisoning by unspecified antipsychotics and neuroleptics, accidental (unintentional), initial encounter: Secondary | ICD-10-CM

## 2016-04-13 DIAGNOSIS — F1410 Cocaine abuse, uncomplicated: Secondary | ICD-10-CM

## 2016-04-13 LAB — GLUCOSE, CAPILLARY
GLUCOSE-CAPILLARY: 202 mg/dL — AB (ref 65–99)
Glucose-Capillary: 190 mg/dL — ABNORMAL HIGH (ref 65–99)

## 2016-04-13 MED ORDER — CLONAZEPAM 1 MG PO TABS
1.0000 mg | ORAL_TABLET | Freq: Once | ORAL | Status: AC
  Administered 2016-04-13: 1 mg via ORAL
  Filled 2016-04-13: qty 1

## 2016-04-13 NOTE — ED Notes (Signed)
IVC  RESCINDED  PER  DR  CLAPACS 

## 2016-04-13 NOTE — Discharge Instructions (Signed)
Please return to the emergency department if you develop thoughts of hurting yourself or anyone else, hallucinations, or any other symptoms concerning to you. °

## 2016-04-13 NOTE — Progress Notes (Signed)
Inpatient Diabetes Program Recommendations  AACE/ADA: New Consensus Statement on Inpatient Glycemic Control (2015)  Target Ranges:  Prepandial:   less than 140 mg/dL      Peak postprandial:   less than 180 mg/dL (1-2 hours)      Critically ill patients:  140 - 180 mg/dL   Results for Ellouise NewerLEASANT, Klover L (MRN [id number]) as of 04/13/2016 10:44  Ref. Range 04/12/2016 22:07 04/13/2016 06:31  Glucose-Capillary Latest Ref Range: 65 - 99 mg/dL 409288 (H) 811190 (H)    To ED with: IVC/ Intentional Drug OD  History: DM  Home DM Meds: Lantus (unsure of dose)  Current Insulin Orders: Lantus 45 units BID     MD- Please consider placing orders for Novolog Moderate Correction Scale/ SSI (0-15 units) TID AC + HS (Use Glycemic Control Order set)  while patient waiting for placement.     --Will follow patient during hospitalization--  Ambrose FinlandJeannine Johnston Lorain Fettes RN, MSN, CDE Diabetes Coordinator Inpatient Glycemic Control Team Team Pager: [phone] (8a-5p)

## 2016-04-13 NOTE — ED Provider Notes (Signed)
-----------------------------------------   7:14 AM on 04/13/2016 -----------------------------------------   Blood pressure 112/63, pulse 83, temperature 98 F (36.7 C), temperature source Oral, resp. rate 16, SpO2 100 %.  The patient had no acute events since last update.  Calm and cooperative at this time.  Disposition is pending Psychiatry/Behavioral Medicine team recommendations.     Loleta Roseory Demika Langenderfer, MD 04/13/16 [phone]
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
## 2016-04-13 NOTE — ED Notes (Signed)
Pt discharged to lobby. Stepmother to give patient ride home. Pt was stable and appreciative at that time. All papers and valuables returned. Verbal understanding expressed. Denies SI/HI and A/VH. Pt given opportunity to express concerns and ask questions.

## 2016-04-13 NOTE — ED Notes (Signed)
Patient complaining of anxiety and requests medication

## 2016-04-13 NOTE — ED Notes (Signed)
Patient reports that her anxiety has lessened

## 2016-04-13 NOTE — ED Notes (Signed)
Meal brought to patient 

## 2016-06-08 ENCOUNTER — Emergency Department
Admission: EM | Admit: 2016-06-08 | Discharge: 2016-06-08 | Disposition: A | Discharge status: Home / Self Care | Payer: Medicaid Other | Attending: Emergency Medicine | Admitting: Emergency Medicine

## 2016-06-08 ENCOUNTER — Encounter: Payer: Self-pay | Admitting: Intensive Care

## 2016-06-08 DIAGNOSIS — J45909 Unspecified asthma, uncomplicated: Secondary | ICD-10-CM | POA: Insufficient documentation

## 2016-06-08 DIAGNOSIS — M545 Low back pain, unspecified: Secondary | ICD-10-CM

## 2016-06-08 DIAGNOSIS — Z794 Long term (current) use of insulin: Secondary | ICD-10-CM | POA: Diagnosis not present

## 2016-06-08 DIAGNOSIS — Z7722 Contact with and (suspected) exposure to environmental tobacco smoke (acute) (chronic): Secondary | ICD-10-CM | POA: Diagnosis not present

## 2016-06-08 DIAGNOSIS — E119 Type 2 diabetes mellitus without complications: Secondary | ICD-10-CM | POA: Insufficient documentation

## 2016-06-08 LAB — GLUCOSE, CAPILLARY: GLUCOSE-CAPILLARY: 231 mg/dL — AB (ref 65–99)

## 2016-06-08 MED ORDER — MELOXICAM 7.5 MG PO TABS: 7.5000 mg | ORAL_TABLET | Freq: Every day | ORAL | 1 refills | Status: AC

## 2016-06-08 MED ORDER — METHYLPREDNISOLONE SODIUM SUCC 125 MG IJ SOLR
125.0000 mg | Freq: Once | INTRAMUSCULAR | Status: AC
Start: 2016-06-08 — End: 2016-06-08
  Administered 2016-06-08: 125 mg via INTRAMUSCULAR
  Filled 2016-06-08: qty 2

## 2016-06-08 NOTE — ED Triage Notes (Addendum)
Patient presents to ER with lower back pain since this AM. Denies any trauma or injury to back. Denies any urinary symptoms. Patient reports this has happened before 2 years ago and she received a cortisone shot and pain went away. Reports no relief with BC or Ibuprofen. Denies chest pain or SOB. Patient reports taking a pregnancy test this AM that was negative

## 2016-06-08 NOTE — ED Provider Notes (Signed)
[facility] Emergency Department Provider Note  ____________________________________________  Time seen: Approximately 3:43 PM  I have reviewed the triage vital signs and the nursing notes.   HISTORY  Chief Complaint Back Pain   HPI Bianca Alvarez is a 31 y.o. female with a history of diabetes presents to the emergency department with low back pain that started today. Patient has a history of intermittent chronic back pain. Patient denies falls or other  mechanisms of trauma. Patient denies fever, weakness, radiculopathy or bowel/bladder incontinence. Patient states that she had a similar episode of back pain two years ago that resolved with solumedrol. 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 She has been afebrile. Patient has been taking Goody Powders but has attempted no other alleviating measures.    Past Medical History:  Diagnosis Date  . Asthma   . Diabetes mellitus without complication [facility])     Patient Active Problem List   Diagnosis Date Noted  . 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 Overdose of antipsychotic 04/13/2016  . Alcohol abuse 04/13/2016  . Cocaine abuse 04/13/2016    Past Surgical History:  Procedure Laterality Date  . ANKLE FRACTURE SURGERY      Prior to Admission medications   Medication Sig Start Date End Date Taking? Authorizing Provider  clonazePAM (KLONOPIN) 1 MG tablet Take 1 tablet by mouth 2 (two) times daily. 02/14/16   Historical Provider, MD  escitalopram (LEXAPRO) 20 MG tablet Take 1 tablet by mouth daily. 01/17/16   Historical Provider, MD  gabapentin (NEURONTIN) 300 MG capsule Take 2 capsules by mouth 2 (two) times daily. 04/03/16   Historical Provider, MD  insulin glargine (LANTUS) 100 UNIT/ML injection Inject into the skin at bedtime.    Historical Provider, MD  QUEtiapine (SEROQUEL) 400 MG tablet Take 1  tablet by mouth daily. 01/17/16   Historical Provider, MD  rOPINIRole (REQUIP) 1 MG tablet Take 2-3 tablets by mouth at bedtime. 01/17/16   Historical Provider, MD    Allergies Patient has no known allergies.  History reviewed. No pertinent family history.  Social History Social History  Substance Use Topics  . Smoking status: Passive Smoke Exposure - Never Smoker  . Smokeless tobacco: Never Used  . Alcohol use Yes    Review of Systems  Constitutional: No fever/chills Eyes: No visual changes. No discharge ENT: No upper respiratory complaints. Cardiovascular: no chest pain. Respiratory: no cough. No SOB. Gastrointestinal: No abdominal pain.  No nausea, no vomiting.  No diarrhea.  No constipation. Genitourinary: Negative for dysuria. No hematuria Musculoskeletal: Patient has low back pain  Skin: Negative for rash, abrasions, lacerations, ecchymosis. Neurological: Negative for headaches, focal weakness or numbness. ____________________________________________   PHYSICAL EXAM:  VITAL SIGNS: ED Triage Vitals  Enc Vitals Group     BP 06/08/16 1508 114/71     Pulse Rate 06/08/16 1508 97     Resp 06/08/16 1508 20     Temp 06/08/16 1508 99.3 F (37.4 C)     Temp Source 06/08/16 1508 Oral     SpO2 06/08/16 1508 100 %     Weight 06/08/16 1509 290 lb (131.5 kg)     Height 06/08/16 1509 5\' 7"  (1.702 m)     Head Circumference --      Peak Flow --      Pain Score 06/08/16 1509 10     Pain Loc --  Pain Edu? --      Excl. in GC? --     Constitutional: Alert and oriented. Well appearing and in no acute distress. Eyes: Conjunctivae are normal. PERRL. EOMI. Head: Atraumatic. Neck: No stridor.  No cervical spine tenderness to palpation. Hematological/Lymphatic/Immunilogical: No cervical lymphadenopathy.  Cardiovascular: Normal rate, regular rhythm. Normal S1 and S2.  Good peripheral circulation. Respiratory: Normal respiratory effort without tachypnea or retractions. Lungs  CTAB. Good air entry to the bases with no decreased or absent breath sounds. Gastrointestinal: Bowel sounds 4 quadrants. Soft and nontender to palpation. No guarding or rigidity. No palpable masses. No distention. No CVA tenderness. Musculoskeletal: Patient has 5/5 strength in the upper and lower extremities bilaterally. Full range of motion at the shoulder, elbow and wrist bilaterally. Full range of motion at the hip, knee and ankle bilaterally. No changes in gait.   Neurologic: Normal speech and language. No gross focal neurologic deficits are appreciated. Cranial nerves: 2-10 normal as tested. Cerebellar: Finger-nose-finger WNL. Sensorimotor: No pronator drift, clonus, sensory loss or abnormal reflexes. Vision: No visual field deficts noted to confrontation.  Speech: No dysarthria or expressive aphasia.  Skin:  Skin is warm, dry and intact. No rash noted. Psychiatric: Mood and affect are normal. Speech and behavior are normal. Patient exhibits appropriate insight and judgement.  ____________________________________________   LABS (all labs ordered are listed, but only abnormal results are displayed)  Labs Reviewed - No data to display ____________________________________________  EKG   ____________________________________________  RADIOLOGY   No results found.  ____________________________________________    PROCEDURES  Procedure(s) performed:    Procedures    Medications - No data to display   ____________________________________________   INITIAL IMPRESSION / ASSESSMENT AND PLAN / ED COURSE  Pertinent labs & imaging results that were available during my care of the patient were reviewed by me and considered in my medical decision making (see chart for details).  Review of the  [place] was performed in accordance of the [place] prior to dispensing any controlled drugs.     Assessment and Plan:  Low Back Pain:  Patient presents to the emergency department  with low back pain without radiculopathy. Patient denies dysuria, hematuria and increased urinary frequency. Patient has a reassuring neurologic and overall physical exam. Patient was given an injection of solumedrol in the emergency department and discharged with mobic. Patient was advised to follow up with her primary care provider if back pain persists. All patient questions were answered.  ____________________________________________  FINAL CLINICAL IMPRESSION(S) / ED DIAGNOSES  Final diagnoses:  None      NEW MEDICATIONS STARTED DURING THIS VISIT:  New Prescriptions   No medications on file        This chart was dictated using voice recognition software/Dragon. Despite best efforts to proofread, errors can occur which can change the meaning. Any change was purely unintentional.    Orvil FeilJaclyn M Elizabet Schweppe, PA-C 06/08/16 1707    Myrna Blazeravid Matthew Schaevitz, MD 06/09/16 [phone]

## 2016-06-08 NOTE — ED Notes (Signed)
See triage note   having lower back pain w/o injury today. No fever or urinary sx;s  Has had similar episodes in past

## 2016-08-14 ENCOUNTER — Emergency Department
Admission: EM | Admit: 2016-08-14 | Discharge: 2016-08-14 | Disposition: A | Discharge status: Home / Self Care | Payer: Medicaid Other | Attending: Emergency Medicine | Admitting: Emergency Medicine

## 2016-08-14 ENCOUNTER — Encounter: Payer: Self-pay | Admitting: Emergency Medicine

## 2016-08-14 DIAGNOSIS — J9801 Acute bronchospasm: Secondary | ICD-10-CM | POA: Insufficient documentation

## 2016-08-14 DIAGNOSIS — Z7722 Contact with and (suspected) exposure to environmental tobacco smoke (acute) (chronic): Secondary | ICD-10-CM | POA: Diagnosis not present

## 2016-08-14 DIAGNOSIS — R0981 Nasal congestion: Secondary | ICD-10-CM | POA: Diagnosis not present

## 2016-08-14 DIAGNOSIS — E119 Type 2 diabetes mellitus without complications: Secondary | ICD-10-CM | POA: Insufficient documentation

## 2016-08-14 DIAGNOSIS — Z794 Long term (current) use of insulin: Secondary | ICD-10-CM | POA: Diagnosis not present

## 2016-08-14 DIAGNOSIS — R05 Cough: Secondary | ICD-10-CM | POA: Diagnosis present

## 2016-08-14 MED ORDER — HYDROCOD POLST-CPM POLST ER 10-8 MG/5ML PO SUER
5.0000 mL | Freq: Once | ORAL | Status: DC
Start: 2016-08-14 — End: 2016-08-14

## 2016-08-14 MED ORDER — METHYLPREDNISOLONE SODIUM SUCC 125 MG IJ SOLR: 125.0000 mg | Freq: Once | INTRAMUSCULAR | Status: DC

## 2016-08-14 MED ORDER — ALBUTEROL SULFATE HFA 108 (90 BASE) MCG/ACT IN AERS: 2.0000 | INHALATION_SPRAY | Freq: Four times a day (QID) | RESPIRATORY_TRACT | 2 refills | Status: DC | PRN

## 2016-08-14 MED ORDER — BENZONATATE 100 MG PO CAPS
200.0000 mg | ORAL_CAPSULE | Freq: Once | ORAL | Status: AC
Start: 2016-08-14 — End: 2016-08-14
  Administered 2016-08-14: 200 mg via ORAL
  Filled 2016-08-14: qty 2

## 2016-08-14 MED ORDER — IPRATROPIUM-ALBUTEROL 0.5-2.5 (3) MG/3ML IN SOLN
3.0000 mL | Freq: Once | RESPIRATORY_TRACT | Status: AC
  Administered 2016-08-14: 3 mL via RESPIRATORY_TRACT
  Filled 2016-08-14 (×2): qty 3

## 2016-08-14 MED ORDER — PSEUDOEPH-BROMPHEN-DM 30-2-10 MG/5ML PO SYRP: 5.0000 mL | ORAL_SOLUTION | Freq: Four times a day (QID) | ORAL | 0 refills | Status: DC | PRN

## 2016-08-14 NOTE — ED Provider Notes (Signed)
[facility] Emergency Department Provider Note   ____________________________________________   First MD Initiated Contact with Patient 08/14/16 1153     (approximate)  I have reviewed the triage vital signs and the nursing notes.   HISTORY  Chief Complaint Cough and Nasal Congestion    HPI Bianca Alvarez is a 31 y.o. female patient complaining of productive cough with nasal congestion. Patient states sputum and nasal drainage of green. Patient denies fever with this complaint. Patient state cough increase with deep inspiration and landed down. No palliative measures for this complaint.patient rates her pain discomfort as a 3/10.patient has history of asthma.   Past Medical History:  Diagnosis Date  . Asthma   . Diabetes mellitus without complication [facility])     Patient Active Problem List   Diagnosis Date Noted  . 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 Overdose of antipsychotic 04/13/2016  . Alcohol abuse 04/13/2016  . Cocaine abuse 04/13/2016    Past Surgical History:  Procedure Laterality Date  . ANKLE FRACTURE SURGERY      Prior to Admission medications   Medication Sig Start Date End Date Taking? Authorizing Provider  albuterol (PROVENTIL HFA;VENTOLIN HFA) 108 (90 Base) MCG/ACT inhaler Inhale 2 puffs into the lungs every 6 (six) hours as needed for wheezing or shortness of breath. 08/14/16   Joni Reining, PA-C  brompheniramine-pseudoephedrine-DM 30-2-10 MG/5ML syrup Take 5 mLs by mouth 4 (four) times daily as needed. 08/14/16   Joni Reining, PA-C  clonazePAM (KLONOPIN) 1 MG tablet Take 1 tablet by mouth 2 (two) times daily. 02/14/16   [provider]  escitalopram (LEXAPRO) 20 MG tablet Take 1 tablet by mouth daily. 01/17/16   [provider]  gabapentin (NEURONTIN) 300 MG capsule Take 2 capsules by mouth 2 (two) times daily. 04/03/16   [provider]  insulin  glargine (LANTUS) 100 UNIT/ML injection Inject into the skin at bedtime.    [provider]  QUEtiapine (SEROQUEL) 400 MG tablet Take 1 tablet by mouth daily. 01/17/16   [provider]  rOPINIRole (REQUIP) 1 MG tablet Take 2-3 tablets by mouth at bedtime. 01/17/16   [provider]    Allergies Patient has no known allergies.  No family history on file.  Social History Social History  Substance Use Topics  . Smoking status: Passive Smoke Exposure - Never Smoker  . Smokeless tobacco: Never Used  . Alcohol use Yes    Review of Systems  Constitutional: No fever/chills Eyes: No visual changes. ENT: No sore throat.asal congestion Cardiovascular: Denies chest pain. Respiratory: wheezing and cough Gastrointestinal: No abdominal pain.  No nausea, no vomiting.  No diarrhea.  No constipation. Genitourinary: Negative for dysuria. Musculoskeletal: Negative for back pain. Skin: Negative for rash. Neurological: Negative for headaches, focal weakness or numbness. Psychiatric:alcohol and cocaine abuse Endocrine:major depression  ____________________________________________   PHYSICAL EXAM:  VITAL SIGNS: ED Triage Vitals  Enc Vitals Group     BP 08/14/16 1119 134/87     Pulse Rate 08/14/16 1119 73     Resp 08/14/16 1119 20     Temp 08/14/16 1119 98.4 F (36.9 C)     Temp Source 08/14/16 1119 Oral     SpO2 08/14/16 1119 99 %     Weight 08/14/16 1119 280 lb (127 kg)     Height 08/14/16 1119 5\' 7"  (1.702 m)     Head Circumference --      Peak Flow --  Pain Score 08/14/16 1121 3     Pain Loc --      Pain Edu? --      Excl. in GC? --     Constitutional: Alert and oriented. Well appearing and in no acute distress. Eyes: Conjunctivae are normal. PERRL. EOMI. Head: Atraumatic. Nose: edematous nasal turbinates Mouth/Throat: Mucous membranes are moist.  Oropharynx non-erythematous. Neck: No stridor.  No cervical spine tenderness to  palpation. Hematological/Lymphatic/Immunilogical: No cervical lymphadenopathy. Cardiovascular: Normal rate, regular rhythm. Grossly normal heart sounds.  Good peripheral circulation. Respiratory: Normal respiratory effort.  No retractions. Lungs CTAB.  Rales and wheezing Gastrointestinal: Soft and nontender. No distention. No abdominal bruits. No CVA tenderness. Neurologic:  Normal speech and language. No gross focal neurologic deficits are appreciated. No gait instability. Skin:  Skin is warm, dry and intact. No rash noted. Psychiatric: Mood and affect are normal. Speech and behavior are normal.  ____________________________________________   LABS (all labs ordered are listed, but only abnormal results are displayed)  Labs Reviewed - No data to display ____________________________________________  EKG   ____________________________________________  RADIOLOGY   ____________________________________________   PROCEDURES  Procedure(s) performed: None  Procedures  Critical Care performed: No  ____________________________________________   INITIAL IMPRESSION / ASSESSMENT AND PLAN / ED COURSE  Pertinent labs & imaging results that were available during my care of the patient were reviewed by me and considered in my medical decision making (see chart for details).  Cough secondary to bronchospasm. Patient complaining improved status post 1 DuoNeb treatment. Patient given discharge care instruction. Patient given a work note and advised follow-up with PCP for continual care.      ____________________________________________   FINAL CLINICAL IMPRESSION(S) / ED DIAGNOSES  Final diagnoses:  Cough due to bronchospasm      NEW MEDICATIONS STARTED DURING THIS VISIT:  New Prescriptions   ALBUTEROL (PROVENTIL HFA;VENTOLIN HFA) 108 (90 BASE) MCG/ACT INHALER    Inhale 2 puffs into the lungs every 6 (six) hours as needed for wheezing or shortness of breath.    BROMPHENIRAMINE-PSEUDOEPHEDRINE-DM 30-2-10 MG/5ML SYRUP    Take 5 mLs by mouth 4 (four) times daily as needed.     Note:  This document was prepared using Dragon voice recognition software and may include unintentional dictation errors.    Joni ReiningSmith, Mozella Rexrode K, PA-C 08/14/16 1244    Sharyn CreamerQuale, Mark, MD 08/15/16 1159

## 2016-08-14 NOTE — ED Triage Notes (Signed)
Pt reports productive cough and nasal congestion for two days. Pt states sputum and nasal discharge are green. Denies fever at home.

## 2016-11-29 ENCOUNTER — Encounter: Payer: Self-pay | Admitting: Emergency Medicine

## 2016-11-29 ENCOUNTER — Emergency Department
Admission: EM | Admit: 2016-11-29 | Discharge: 2016-11-29 | Disposition: A | Discharge status: Home / Self Care | Payer: Self-pay | Attending: Emergency Medicine | Admitting: Emergency Medicine

## 2016-11-29 DIAGNOSIS — W25XXXA Contact with sharp glass, initial encounter: Secondary | ICD-10-CM | POA: Insufficient documentation

## 2016-11-29 DIAGNOSIS — Y939 Activity, unspecified: Secondary | ICD-10-CM | POA: Insufficient documentation

## 2016-11-29 DIAGNOSIS — Y999 Unspecified external cause status: Secondary | ICD-10-CM | POA: Insufficient documentation

## 2016-11-29 DIAGNOSIS — Z794 Long term (current) use of insulin: Secondary | ICD-10-CM | POA: Insufficient documentation

## 2016-11-29 DIAGNOSIS — S90852A Superficial foreign body, left foot, initial encounter: Secondary | ICD-10-CM

## 2016-11-29 DIAGNOSIS — Z79899 Other long term (current) drug therapy: Secondary | ICD-10-CM | POA: Insufficient documentation

## 2016-11-29 DIAGNOSIS — Y929 Unspecified place or not applicable: Secondary | ICD-10-CM | POA: Insufficient documentation

## 2016-11-29 DIAGNOSIS — E119 Type 2 diabetes mellitus without complications: Secondary | ICD-10-CM | POA: Insufficient documentation

## 2016-11-29 DIAGNOSIS — Z7722 Contact with and (suspected) exposure to environmental tobacco smoke (acute) (chronic): Secondary | ICD-10-CM | POA: Insufficient documentation

## 2016-11-29 DIAGNOSIS — J45909 Unspecified asthma, uncomplicated: Secondary | ICD-10-CM | POA: Insufficient documentation

## 2016-11-29 MED ORDER — CEPHALEXIN 500 MG PO CAPS: 500.0000 mg | ORAL_CAPSULE | Freq: Three times a day (TID) | ORAL | 0 refills | Status: DC

## 2016-11-29 NOTE — Discharge Instructions (Signed)
Follow-up with Dr. Orland Jarredroxler area, Monday for an appointment. His contact information is under discharge papers. Begin taking Keflex 500 mg 3 times a day for 10 days. He may also take Tylenol or ibuprofen if needed.

## 2016-11-29 NOTE — ED Provider Notes (Signed)
[facility] Emergency Department Provider Note  ___________________________________________   None    (approximate)  I have reviewed the triage vital signs and the nursing notes.   HISTORY  Chief Complaint Foot Pain and Foreign Body   HPI Bianca Alvarez is a 31 y.o. female presents to the emergency room with complaint of small piece of glass possibly in the bottom left foot that has been there for approximately 2 weeks. Patient states that she feels that she stepped on some small broken glass. She denies any fever or chills. She denies any redness to the area and the area on her foot has completely healed. She states that at the time she was not wearing shoes. Patient is a diabetic.   Past Medical History:  Diagnosis Date  . Asthma   . Diabetes mellitus without complication [facility]HCC)     Patient Active Problem List   Diagnosis Date Noted  . 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 Overdose of antipsychotic 04/13/2016  . Alcohol abuse 04/13/2016  . Cocaine abuse 04/13/2016    Past Surgical History:  Procedure Laterality Date  . ANKLE FRACTURE SURGERY      Prior to Admission medications   Medication Sig Start Date End Date Taking? Authorizing Provider  albuterol (PROVENTIL HFA;VENTOLIN HFA) 108 (90 Base) MCG/ACT inhaler Inhale 2 puffs into the lungs every 6 (six) hours as needed for wheezing or shortness of breath. 08/14/16   Joni ReiningSmith, Ronald K, PA-C  cephALEXin (KEFLEX) 500 MG capsule Take 1 capsule (500 mg total) by mouth 3 (three) times daily. 11/29/16   Tommi RumpsSummers, Rhonda L, PA-C  clonazePAM (KLONOPIN) 1 MG tablet Take 1 tablet by mouth 2 (two) times daily. 02/14/16   [provider]  escitalopram (LEXAPRO) 20 MG tablet Take 1 tablet by mouth daily. 01/17/16   [provider]  gabapentin (NEURONTIN) 300 MG capsule Take 2 capsules by mouth 2 (two) times daily. 04/03/16   [provider]    insulin glargine (LANTUS) 100 UNIT/ML injection Inject into the skin at bedtime.    [provider]  QUEtiapine (SEROQUEL) 400 MG tablet Take 1 tablet by mouth daily. 01/17/16   [provider]  rOPINIRole (REQUIP) 1 MG tablet Take 2-3 tablets by mouth at bedtime. 01/17/16   [provider]    Allergies Patient has no known allergies.  History reviewed. No pertinent family history.  Social History Social History  Substance Use Topics  . Smoking status: Passive Smoke Exposure - Never Smoker  . Smokeless tobacco: Never Used  . Alcohol use Yes    Review of Systems Constitutional: No fever/chills Cardiovascular: Denies chest pain. Respiratory: Denies shortness of breath. Musculoskeletal: Positive left foot pain. Skin: Positive possible foreign body in plantar aspect left foot. Neurological: Negative for headaches, focal weakness or numbness. ___________________________________________   PHYSICAL EXAM:  VITAL SIGNS: ED Triage Vitals  Enc Vitals Group     BP 11/29/16 1325 125/82     Pulse Rate 11/29/16 1325 67     Resp 11/29/16 1325 18     Temp 11/29/16 1325 98.3 F (36.8 C)     Temp Source 11/29/16 1325 Oral     SpO2 11/29/16 1325 100 %     Weight 11/29/16 1325 286 lb (129.7 kg)     Height 11/29/16 1325 5\' 7"  (1.702 m)     Head Circumference --      Peak Flow --      Pain Score  11/29/16 1326 6     Pain Loc --      Pain Edu? --      Excl. in GC? --    Constitutional: Alert and oriented. Well appearing and in no acute distress. Head: Atraumatic. Neck: No stridor.   Cardiovascular: Normal rate, regular rhythm. Grossly normal heart sounds.  Good peripheral circulation. Respiratory: Normal respiratory effort.  No retractions. Lungs CTAB. Musculoskeletal: On examination of left foot there is no gross deformity noted. Motor sensory function intact. Neurologic:  Normal speech and language. No gross focal neurologic deficits are appreciated. No  gait instability. Skin:  Skin is warm, dry and intact. There is no erythema or drainage noted to the plantar aspect of the left foot. There is one area mid-sole that is tender to touch that possibly could have a foreign body in it. Area is healed completely. Psychiatric: Mood and affect are normal. Speech and behavior are normal.  ____________________________________________   LABS (all labs ordered are listed, but only abnormal results are displayed)  Labs Reviewed - No data to display    PROCEDURES  Procedure(s) performed: None  Procedures  Critical Care performed: No  ____________________________________________   INITIAL IMPRESSION / ASSESSMENT AND PLAN / ED COURSE  Pertinent labs & imaging results that were available during my care of the patient were reviewed by me and considered in my medical decision making (see chart for details).  Patient was placed on Keflex 500 mg 3 times a day for 10 days and instructions to follow-up with Dr. Orland Jarred who is the podiatrist on call. Patient is encouraged to call on Monday for an appointment for further evaluation of her foot.   ___________________________________________   FINAL CLINICAL IMPRESSION(S) / ED DIAGNOSES  Final diagnoses:  Foreign body in left foot, initial encounter      NEW MEDICATIONS STARTED DURING THIS VISIT:  Discharge Medication List as of 11/29/2016  2:17 PM    START taking these medications   Details  cephALEXin (KEFLEX) 500 MG capsule Take 1 capsule (500 mg total) by mouth 3 (three) times daily., Starting Sun 11/29/2016, Print         Note:  This document was prepared using Dragon voice recognition software and may include unintentional dictation errors.    Tommi Rumps, PA-C 11/29/16 1518    Dionne Bucy, MD 11/29/16 [phone]

## 2016-11-29 NOTE — ED Triage Notes (Signed)
Pt states she has a small piece of glass in the bottom of her left foot.  Pt states certain ways she walks her foot hurts. Pt states she is a diabetic.  Pt states about 2 weeks ago she stepped on glass with her left foot and states the area has healed.

## 2016-12-26 ENCOUNTER — Emergency Department
Admission: EM | Admit: 2016-12-26 | Discharge: 2016-12-26 | Disposition: A | Discharge status: Home / Self Care | Payer: Medicaid Other | Attending: Emergency Medicine | Admitting: Emergency Medicine

## 2016-12-26 ENCOUNTER — Encounter: Payer: Self-pay | Admitting: Emergency Medicine

## 2016-12-26 DIAGNOSIS — J45909 Unspecified asthma, uncomplicated: Secondary | ICD-10-CM | POA: Insufficient documentation

## 2016-12-26 DIAGNOSIS — Z794 Long term (current) use of insulin: Secondary | ICD-10-CM | POA: Insufficient documentation

## 2016-12-26 DIAGNOSIS — K029 Dental caries, unspecified: Secondary | ICD-10-CM | POA: Insufficient documentation

## 2016-12-26 DIAGNOSIS — Z7722 Contact with and (suspected) exposure to environmental tobacco smoke (acute) (chronic): Secondary | ICD-10-CM | POA: Insufficient documentation

## 2016-12-26 DIAGNOSIS — E119 Type 2 diabetes mellitus without complications: Secondary | ICD-10-CM | POA: Insufficient documentation

## 2016-12-26 DIAGNOSIS — Z79899 Other long term (current) drug therapy: Secondary | ICD-10-CM | POA: Insufficient documentation

## 2016-12-26 MED ORDER — TRAMADOL HCL 50 MG PO TABS: 50.0000 mg | ORAL_TABLET | Freq: Four times a day (QID) | ORAL | 0 refills | Status: DC | PRN

## 2016-12-26 MED ORDER — MELOXICAM 15 MG PO TABS: 15.0000 mg | ORAL_TABLET | Freq: Every day | ORAL | 0 refills | Status: DC

## 2016-12-26 NOTE — ED Triage Notes (Signed)
Pain in rt lower jaw with eating. Does not have dentist at this time

## 2016-12-26 NOTE — Discharge Instructions (Signed)
OPTIONS FOR DENTAL FOLLOW UP CARE ° °[place] Department of Health and Human Services - Local Safety Net Dental Clinics °http://www.ncdhhs.gov/dph/oralhealth/services/safetynetclinics.htm °  °[facility] (336-562-3123) ° °[facility] [place] (919-933-9087) ° °[facility] ([phone] ext [phone]) ° °[facility] ([phone]) ° °[facility] ([phone]) °This clinic caters to the indigent population and is on a lottery system. °Location: °[facility], [address], [place] °Clinic Hours: °Wednesdays from 6pm - 9pm, patients seen by a lottery system. °For dates, call or go to www.med.[facility].edu/[facility]/patients/Dental-[facility] °Services: °Cleanings, fillings and simple extractions. °Payment Options: °DENTAL WORK IS FREE OF CHARGE. Bring proof of income or support. °Best way to get seen: °Arrive at 5:15 pm - this is a lottery, NOT first come/first serve, so arriving earlier will not increase your chances of being seen. °  °  °[facility] °[phone] °Select option 1 for emergencies °  °Location: °[facility], [address], [place] °Clinic Hours: °No walk-ins accepted - call the day before to schedule an appointment. °Check in times are 9:30 am and 1:30 pm. °Services: °Simple extractions, temporary fillings, pulpectomy/pulp debridement, uncomplicated abscess drainage. °Payment Options: °PAYMENT IS DUE AT THE TIME OF SERVICE.  Fee is usually $100-200, additional surgical procedures (e.g. abscess drainage) may be extra. °Cash, checks, Visa/MasterCard accepted.  Can file Medicaid if patient is covered for dental - patient should call case worker to check. °No discount for [facility] patients. °Best way to get seen: °MUST call the day before and get onto the schedule. Can usually be seen the next 1-2 days. No walk-ins accepted. °  °  °[facility] °[phone] °   °Location: °[facility], [address], [place] °Clinic Hours: °M, W, Th, F 8am or 1:30pm, Tues 9a or 1:30 - first come/first served. °Services: °Simple extractions, temporary fillings, uncomplicated abscess drainage.  You do not need to be an [place] resident. °Payment Options: °PAYMENT IS DUE AT THE TIME OF SERVICE. °Dental insurance, otherwise sliding scale - bring proof of income or support. °Depending on income and treatment needed, cost is usually $50-200. °Best way to get seen: °Arrive early as it is first come/first served. °  °  °[facility] °[phone] °  °Location: °[address] °Clinic Hours: °Mon-Thu 8a-5p °Services: °Most basic dental services including extractions and fillings. °Payment Options: °PAYMENT IS DUE AT THE TIME OF SERVICE. °Sliding scale, up to 50% off - bring proof if income or support. °Medicaid with dental option accepted. °Best way to get seen: °Call to schedule an appointment, can usually be seen within 2 weeks OR they will try to see walk-ins - show up at 8a or 2p (you may have to wait). °  °  °[facility] °919-245-2435 °[place] RESIDENTS ONLY °  °Location: °[facility], [address], [place], [place] [address] °Clinic Hours: By appointment only. °Monday - Thursday 8am-5pm, Friday 8am-12pm °Services: Cleanings, fillings, extractions. °Payment Options: °PAYMENT IS DUE AT THE TIME OF SERVICE. °Cash, Visa or MasterCard. Sliding scale - $30 minimum per service. °Best way to get seen: °Come in to office, complete packet and make an appointment - need proof of income °or support monies for each household member and proof of [place] residence. °Usually takes about a month to get in. °  °  °[facility] °[phone] °  °Location: °[address]   [place] °Clinic Hours: Walk-in Urgent Care Dental Services are offered Monday-Friday  mornings only. °The numbers of emergencies accepted daily is limited to the number of °providers available. °Maximum 15 - Mondays, Wednesdays & Thursdays °Maximum 10 - Tuesdays & Fridays °Services: °You do not need to be a  [place] resident to be seen for a dental emergency. °Emergencies are defined as pain, swelling, abnormal bleeding, or dental trauma. Walkins will receive x-rays if needed. °NOTE: Dental cleaning is not an emergency. °Payment Options: °PAYMENT IS DUE AT THE TIME OF SERVICE. °Minimum co-pay is $40.00 for uninsured patients. °Minimum co-pay is $3.00 for Medicaid with dental coverage. °Dental Insurance is accepted and must be presented at time of visit. °Medicare does not cover dental. °Forms of payment: Cash, credit card, checks. °Best way to get seen: °If not previously registered with the clinic, walk-in dental registration begins at 7:15 am and is on a first come/first serve basis. °If previously registered with the clinic, call to make an appointment. °  °  °[facility] °[phone] °[place] RESIDENTS ONLY °  °Location: °[address], [place], [place] °Clinic Hours: °Mon-Thu 10a-2p °Services: Extractions only! °Payment Options: °FREE (donations accepted) - bring proof of income or support °Best way to get seen: °Call and schedule an appointment OR come at 8am on the 1st Monday of every month (except for holidays) when it is first come/first served. °  °  °[facility] °[phone] °  °Location: °[address], [place] °Clinic Hours: °Friday mornings °Services, Payment Options, Best way to get seen: °Call for info °

## 2016-12-26 NOTE — ED Provider Notes (Signed)
[facility] Emergency Department Provider Note  ____________________________________________  Time seen: Approximately 3:49 PM  I have reviewed the triage vital signs and the nursing notes.   HISTORY  Chief Complaint Dental Pain    HPI Bianca Alvarez is a 31 y.o. female who presents to emergency department complaining of right lower jaw pain. Patient reports that she has poor dentition and has had some pain to her first and second molar right lower dentition that is radiating into her jaw. Patient reports the pain is improved if she is biting down on something, but she called exacerbate the pain. Patient denies any swelling, fevers or chills, difficulty breathing or swallowing. Patient does not have a regular dentist but states that she has contacted a dentist for several weeks out on her appointment. Patient has not tried any medications for this complaint prior to arrival. No other complaints at this time.   Past Medical History:  Diagnosis Date  . Asthma   . Diabetes mellitus without complication [facility])     Patient Active Problem List   Diagnosis Date Noted  . 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 Overdose of antipsychotic 04/13/2016  . Alcohol abuse 04/13/2016  . Cocaine abuse (HCC) 04/13/2016    Past Surgical History:  Procedure Laterality Date  . ANKLE FRACTURE SURGERY      Prior to Admission medications   Medication Sig Start Date End Date Taking? Authorizing Provider  albuterol (PROVENTIL HFA;VENTOLIN HFA) 108 (90 Base) MCG/ACT inhaler Inhale 2 puffs into the lungs every 6 (six) hours as needed for wheezing or shortness of breath. 08/14/16   Joni Reining, PA-C  cephALEXin (KEFLEX) 500 MG capsule Take 1 capsule (500 mg total) by mouth 3 (three) times daily. 11/29/16   Tommi Rumps, PA-C  clonazePAM (KLONOPIN) 1 MG tablet Take 1 tablet by mouth 2 (two) times daily. 02/14/16   [provider]  escitalopram (LEXAPRO) 20 MG tablet Take 1 tablet by mouth daily. 01/17/16   [provider]  gabapentin (NEURONTIN) 300 MG capsule Take 2 capsules by mouth 2 (two) times daily. 04/03/16   [provider]  insulin glargine (LANTUS) 100 UNIT/ML injection Inject into the skin at bedtime.    [provider]  meloxicam (MOBIC) 15 MG tablet Take 1 tablet (15 mg total) by mouth daily. 12/26/16   Jolaine Fryberger, Delorise Royals, PA-C  QUEtiapine (SEROQUEL) 400 MG tablet Take 1 tablet by mouth daily. 01/17/16   [provider]  rOPINIRole (REQUIP) 1 MG tablet Take 2-3 tablets by mouth at bedtime. 01/17/16   [provider]  traMADol (ULTRAM) 50 MG tablet Take 1 tablet (50 mg total) by mouth every 6 (six) hours as needed. 12/26/16   Veria Stradley, Delorise Royals, PA-C    Allergies Patient has no known allergies.  History reviewed. No pertinent family history.  Social History Social History  Substance Use Topics  . Smoking status: Passive Smoke Exposure - Never Smoker  . Smokeless tobacco: Never Used  . Alcohol use Yes     Review of Systems  Constitutional: No fever/chills Eyes: No visual changes. No discharge ENT: positive for right lower dental and jaw pain. Cardiovascular: no chest pain. Respiratory: no cough. No SOB. Gastrointestinal: No abdominal pain.  No nausea, no vomiting.  No diarrhea.  No constipation. Musculoskeletal: Negative for musculoskeletal pain. Skin: Negative for rash, abrasions, lacerations, ecchymosis. Neurological: Negative for headaches, focal weakness or numbness. 10-point ROS otherwise negative.  ____________________________________________  PHYSICAL EXAM:  VITAL SIGNS: ED Triage Vitals [12/26/16 1543]  Enc Vitals Group     BP 111/73     Pulse Rate (!) 103     Resp 16     Temp 97.9 F (36.6 C)     Temp Source Oral     SpO2 99 %     Weight 277 lb (125.6 kg)     Height  (1.702 m)     Head Circumference       Peak Flow      Pain Score 0     Pain Loc      Pain Edu?      Excl. in GC?      Constitutional: Alert and oriented. Well appearing and in no acute distress. Eyes: Conjunctivae are normal. PERRL. EOMI. Head: Atraumatic. ENT:      Ears:       Nose: No congestion/rhinnorhea.      Mouth/Throat: Mucous membranes are moist. Multiple caries and mild dental erosion noted throughout dentition. Patient does not have any erythema or edema surrounding any tooth. Patient is tender to palpation over the second molar right lower dentition. Caries are observed. No fractures. No erythema in the oropharynx. Uvula is midline. Neck: No stridor.   Hematological/Lymphatic/Immunilogical: No cervical lymphadenopathy. Cardiovascular: Normal rate, regular rhythm. Normal S1 and S2.  Good peripheral circulation. Respiratory: Normal respiratory effort without tachypnea or retractions. Lungs CTAB. Good air entry to the bases with no decreased or absent breath sounds. Musculoskeletal: Full range of motion to all extremities. No gross deformities appreciated. Neurologic:  Normal speech and language. No gross focal neurologic deficits are appreciated.  Skin:  Skin is warm, dry and intact. No rash noted. Psychiatric: Mood and affect are normal. Speech and behavior are normal. Patient exhibits appropriate insight and judgement.   ____________________________________________   LABS (all labs ordered are listed, but only abnormal results are displayed)  Labs Reviewed - No data to display ____________________________________________  EKG   ____________________________________________  RADIOLOGY   No results found.  ____________________________________________    PROCEDURES  Procedure(s) performed:    Procedures    Medications - No data to display   ____________________________________________   INITIAL IMPRESSION / ASSESSMENT AND PLAN / ED COURSE  Pertinent labs & imaging results that  were available during my care of the patient were reviewed by me and considered in my medical decision making (see chart for details).  Review of the [place] CSRS was performed in accordance of the [facility] prior to dispensing any controlled drugs.     Patient's diagnosis is consistent with ain from dental caries. Patient has dental caries noted to second molar right lower dentition. This area is tender to palpation with tongue depressor. No erythema or edema consistent with infection.. Patient will be discharged home with prescriptions for anti-inflammatories and Ultram. Patient is to follow up with dentist as needed or otherwise directed. Patient is given ED precautions to return to the ED for any worsening or new symptoms.     ____________________________________________  FINAL CLINICAL IMPRESSION(S) / ED DIAGNOSES  Final diagnoses:  Pain due to dental caries      NEW MEDICATIONS STARTED DURING THIS VISIT:  New Prescriptions   MELOXICAM (MOBIC) 15 MG TABLET    Take 1 tablet (15 mg total) by mouth daily.   TRAMADOL (ULTRAM) 50 MG TABLET    Take 1 tablet (50 mg total) by mouth every 6 (six) hours as needed.  This chart was dictated using voice recognition software/Dragon. Despite best efforts to proofread, errors can occur which can change the meaning. Any change was purely unintentional.    Racheal Patches, PA-C 12/26/16 1620    Sharman Cheek, MD 12/26/16 1843

## 2016-12-30 ENCOUNTER — Encounter: Payer: Self-pay | Admitting: Emergency Medicine

## 2016-12-30 ENCOUNTER — Emergency Department
Admission: EM | Admit: 2016-12-30 | Discharge: 2016-12-30 | Disposition: A | Discharge status: Home / Self Care | Payer: Medicaid Other | Attending: Emergency Medicine | Admitting: Emergency Medicine

## 2016-12-30 DIAGNOSIS — L739 Follicular disorder, unspecified: Secondary | ICD-10-CM | POA: Insufficient documentation

## 2016-12-30 DIAGNOSIS — Z7722 Contact with and (suspected) exposure to environmental tobacco smoke (acute) (chronic): Secondary | ICD-10-CM | POA: Insufficient documentation

## 2016-12-30 DIAGNOSIS — Z79899 Other long term (current) drug therapy: Secondary | ICD-10-CM | POA: Insufficient documentation

## 2016-12-30 DIAGNOSIS — J45909 Unspecified asthma, uncomplicated: Secondary | ICD-10-CM | POA: Insufficient documentation

## 2016-12-30 DIAGNOSIS — Z794 Long term (current) use of insulin: Secondary | ICD-10-CM | POA: Insufficient documentation

## 2016-12-30 DIAGNOSIS — E119 Type 2 diabetes mellitus without complications: Secondary | ICD-10-CM | POA: Insufficient documentation

## 2016-12-30 MED ORDER — SULFAMETHOXAZOLE-TRIMETHOPRIM 800-160 MG PO TABS: 1.0000 | ORAL_TABLET | Freq: Two times a day (BID) | ORAL | 0 refills | Status: AC

## 2016-12-30 MED ORDER — SULFAMETHOXAZOLE-TRIMETHOPRIM 800-160 MG PO TABS
1.0000 | ORAL_TABLET | Freq: Once | ORAL | Status: AC
  Administered 2016-12-30: 1 via ORAL
  Filled 2016-12-30: qty 1

## 2016-12-30 NOTE — ED Provider Notes (Signed)
[facility] Emergency Department Provider Note  ____________________________________________  Time seen: Approximately 5:22 PM  I have reviewed the triage vital signs and the nursing notes.   HISTORY  Chief Complaint Abscess    HPI Bianca Alvarez is a 31 y.o. female with a history of cutaneous abscesses presents to the emergency department with right labia majora 10/10 pain worsened with tight clothing. Patient has noticed a 1 cm region of erythema. She has tried warm compresses but no other alleviating measures. She denies nausea, vomiting, fever or chills.    Past Medical History:  Diagnosis Date  . Asthma   . Diabetes mellitus without complication [facility])     Patient Active Problem List   Diagnosis Date Noted  . 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 Overdose of antipsychotic 04/13/2016  . Alcohol abuse 04/13/2016  . Cocaine abuse (HCC) 04/13/2016    Past Surgical History:  Procedure Laterality Date  . ANKLE FRACTURE SURGERY      Prior to Admission medications   Medication Sig Start Date End Date Taking? Authorizing Provider  albuterol (PROVENTIL HFA;VENTOLIN HFA) 108 (90 Base) MCG/ACT inhaler Inhale 2 puffs into the lungs every 6 (six) hours as needed for wheezing or shortness of breath. 08/14/16   Joni Reining, PA-C  cephALEXin (KEFLEX) 500 MG capsule Take 1 capsule (500 mg total) by mouth 3 (three) times daily. 11/29/16   Tommi Rumps, PA-C  clonazePAM (KLONOPIN) 1 MG tablet Take 1 tablet by mouth 2 (two) times daily. 02/14/16   [provider]  escitalopram (LEXAPRO) 20 MG tablet Take 1 tablet by mouth daily. 01/17/16   [provider]  gabapentin (NEURONTIN) 300 MG capsule Take 2 capsules by mouth 2 (two) times daily. 04/03/16   [provider]  insulin glargine (LANTUS) 100 UNIT/ML injection Inject into the skin at bedtime.    [provider]  meloxicam  (MOBIC) 15 MG tablet Take 1 tablet (15 mg total) by mouth daily. 12/26/16   Cuthriell, Delorise Royals, PA-C  QUEtiapine (SEROQUEL) 400 MG tablet Take 1 tablet by mouth daily. 01/17/16   [provider]  rOPINIRole (REQUIP) 1 MG tablet Take 2-3 tablets by mouth at bedtime. 01/17/16   [provider]  sulfamethoxazole-trimethoprim (BACTRIM DS,SEPTRA DS) 800-160 MG tablet Take 1 tablet by mouth 2 (two) times daily. 12/30/16 01/06/17  Orvil Feil, PA-C  traMADol (ULTRAM) 50 MG tablet Take 1 tablet (50 mg total) by mouth every 6 (six) hours as needed. 12/26/16   Cuthriell, Delorise Royals, PA-C    Allergies Patient has no known allergies.  History reviewed. No pertinent family history.  Social History Social History  Substance Use Topics  . Smoking status: Passive Smoke Exposure - Never Smoker  . Smokeless tobacco: Never Used  . Alcohol use Yes     Review of Systems  Constitutional: No fever/chills Eyes: No visual changes. No discharge ENT: No upper respiratory complaints. Cardiovascular: no chest pain. Respiratory: no cough. No SOB. Gastrointestinal: No abdominal pain.  No nausea, no vomiting.  No diarrhea.  No constipation. Musculoskeletal: Negative for musculoskeletal pain. Skin: Patient has folliculitis of labia majora.  Neurological: Negative for headaches, focal weakness or numbness.   ____________________________________________   PHYSICAL EXAM:  VITAL SIGNS: ED Triage Vitals [12/30/16 1700]  Enc Vitals Group     BP      Pulse      Resp      Temp  Temp src      SpO2      Weight      Height      Head Circumference      Peak Flow      Pain Score 7     Pain Loc      Pain Edu?      Excl. in GC?      Constitutional: Alert and oriented. Well appearing and in no acute distress. Eyes: Conjunctivae are normal. PERRL. EOMI. Head: Atraumatic. Cardiovascular: Normal rate, regular rhythm. Normal S1 and S2.  Good peripheral circulation. Respiratory:  Normal respiratory effort without tachypnea or retractions. Lungs CTAB. Good air entry to the bases with no decreased or absent breath sounds. Musculoskeletal: Full range of motion to all extremities. No gross deformities appreciated. Neurologic:  Normal speech and language. No gross focal neurologic deficits are appreciated.  Skin: Patient has 1 cm of erythema surrounding hair follicles of the labia majora, right. No induration or palpable fluctuance.  Psychiatric: Mood and affect are normal. Speech and behavior are normal. Patient exhibits appropriate insight and judgement.   ____________________________________________   LABS (all labs ordered are listed, but only abnormal results are displayed)  Labs Reviewed - No data to display ____________________________________________  EKG   ____________________________________________  RADIOLOGY   No results found.  ____________________________________________    PROCEDURES  Procedure(s) performed:    Procedures    Medications  sulfamethoxazole-trimethoprim (BACTRIM DS,SEPTRA DS) 800-160 MG per tablet 1 tablet (not administered)     ____________________________________________   INITIAL IMPRESSION / ASSESSMENT AND PLAN / ED COURSE  Pertinent labs & imaging results that were available during my care of the patient were reviewed by me and considered in my medical decision making (see chart for details).  Review of the [place] CSRS was performed in accordance of the [facility] prior to dispensing any controlled drugs.     Assessment and Plan: Folliculitis:  Differential diagnosis includes folliculitis vs. early abscess formation. Absence of induration and palpable fluctuance increases suspicion for folliculitis. Patient declined incision and drainage in the emergency department in favor of a trial of bactrim. Patient was given her first dose of bactrim in the ED and discharged with bactrim. She was advised to follow up with  primary care as needed.     ____________________________________________  FINAL CLINICAL IMPRESSION(S) / ED DIAGNOSES  Final diagnoses:  Folliculitis      NEW MEDICATIONS STARTED DURING THIS VISIT:  New Prescriptions   SULFAMETHOXAZOLE-TRIMETHOPRIM (BACTRIM DS,SEPTRA DS) 800-160 MG TABLET    Take 1 tablet by mouth 2 (two) times daily.        This chart was dictated using voice recognition software/Dragon. Despite best efforts to proofread, errors can occur which can change the meaning. Any change was purely unintentional.    Orvil Feil, PA-C 12/30/16 1812    Myrna Blazer, MD 12/30/16 [phone]

## 2016-12-30 NOTE — ED Triage Notes (Signed)
Pt c/o abscess to vaginal lip for the past 2 days, pt states she has hx of abscess before Pt states it is painful to rub on her clothes. Pt states the area is soft to the touch and does not have a head.

## 2017-02-19 ENCOUNTER — Other Ambulatory Visit: Payer: Self-pay

## 2017-02-19 ENCOUNTER — Emergency Department
Admission: EM | Admit: 2017-02-19 | Discharge: 2017-02-19 | Disposition: A | Discharge status: Home / Self Care | Payer: Medicaid Other | Attending: Emergency Medicine | Admitting: Emergency Medicine

## 2017-02-19 DIAGNOSIS — Z7722 Contact with and (suspected) exposure to environmental tobacco smoke (acute) (chronic): Secondary | ICD-10-CM | POA: Insufficient documentation

## 2017-02-19 DIAGNOSIS — Z79899 Other long term (current) drug therapy: Secondary | ICD-10-CM | POA: Insufficient documentation

## 2017-02-19 DIAGNOSIS — Y9389 Activity, other specified: Secondary | ICD-10-CM | POA: Insufficient documentation

## 2017-02-19 DIAGNOSIS — E119 Type 2 diabetes mellitus without complications: Secondary | ICD-10-CM | POA: Insufficient documentation

## 2017-02-19 DIAGNOSIS — Y92002 Bathroom of unspecified non-institutional (private) residence single-family (private) house as the place of occurrence of the external cause: Secondary | ICD-10-CM | POA: Insufficient documentation

## 2017-02-19 DIAGNOSIS — Y999 Unspecified external cause status: Secondary | ICD-10-CM | POA: Insufficient documentation

## 2017-02-19 DIAGNOSIS — X501XXA Overexertion from prolonged static or awkward postures, initial encounter: Secondary | ICD-10-CM | POA: Insufficient documentation

## 2017-02-19 DIAGNOSIS — J45909 Unspecified asthma, uncomplicated: Secondary | ICD-10-CM | POA: Insufficient documentation

## 2017-02-19 DIAGNOSIS — Z794 Long term (current) use of insulin: Secondary | ICD-10-CM | POA: Insufficient documentation

## 2017-02-19 DIAGNOSIS — S39012A Strain of muscle, fascia and tendon of lower back, initial encounter: Secondary | ICD-10-CM | POA: Insufficient documentation

## 2017-02-19 MED ORDER — CYCLOBENZAPRINE HCL 10 MG PO TABS: 10.0000 mg | ORAL_TABLET | Freq: Three times a day (TID) | ORAL | 0 refills | Status: DC | PRN

## 2017-02-19 MED ORDER — HYDROMORPHONE HCL 1 MG/ML IJ SOLN
1.0000 mg | Freq: Once | INTRAMUSCULAR | Status: AC
  Administered 2017-02-19: 1 mg via INTRAMUSCULAR
  Filled 2017-02-19: qty 1

## 2017-02-19 MED ORDER — METHOCARBAMOL 750 MG PO TABS: 1500.0000 mg | ORAL_TABLET | Freq: Four times a day (QID) | ORAL | 0 refills | Status: DC

## 2017-02-19 MED ORDER — ORPHENADRINE CITRATE 30 MG/ML IJ SOLN
60.0000 mg | Freq: Two times a day (BID) | INTRAMUSCULAR | Status: DC
  Administered 2017-02-19: 60 mg via INTRAMUSCULAR
  Filled 2017-02-19: qty 2

## 2017-02-19 MED ORDER — OXYCODONE-ACETAMINOPHEN 5-325 MG PO TABS: 1.0000 | ORAL_TABLET | Freq: Four times a day (QID) | ORAL | 0 refills | Status: AC | PRN

## 2017-02-19 MED ORDER — TRAMADOL HCL 50 MG PO TABS: 50.0000 mg | ORAL_TABLET | Freq: Four times a day (QID) | ORAL | 0 refills | Status: DC | PRN

## 2017-02-19 NOTE — ED Triage Notes (Signed)
Arrives c/o lower back pain x 1 day.  States she "felt back catch" yesterday when she was picking up paper off of the floor.

## 2017-02-19 NOTE — ED Triage Notes (Signed)
Pt states she was cleaning the bathroom floor yesterday and since having lower back pain.

## 2017-02-19 NOTE — ED Provider Notes (Signed)
[facility] Emergency Department Provider Note   ____________________________________________   First MD Initiated Contact with Patient 02/19/17 1319     (approximate)  I have reviewed the triage vital signs and the nursing notes.   HISTORY  Chief Complaint Back Pain    HPI Bianca Alvarez is a 31 y.o. female patient states she was cleaning the bathroom floor yesterday while bending over developing acute low back pain. Patient states he feel helped back has "locked up". Patient denies radicular component to her pain. Patient has bilateral bowel dysfunction. No palliative measures for complaint.Patient rates pain as 8/10. Patient described a pain as "sharp".   Past Medical History:  Diagnosis Date  . Asthma   . Diabetes mellitus without complication [facility](HCC)     Patient Active Problem List   Diagnosis Date Noted  . 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 Overdose of antipsychotic 04/13/2016  . Alcohol abuse 04/13/2016  . Cocaine abuse (HCC) 04/13/2016    Past Surgical History:  Procedure Laterality Date  . ANKLE FRACTURE SURGERY      Prior to Admission medications   Medication Sig Start Date End Date Taking? Authorizing Provider  albuterol (PROVENTIL HFA;VENTOLIN HFA) 108 (90 Base) MCG/ACT inhaler Inhale 2 puffs into the lungs every 6 (six) hours as needed for wheezing or shortness of breath. 08/14/16   Joni ReiningSmith, Johnie Makki K, PA-C  cephALEXin (KEFLEX) 500 MG capsule Take 1 capsule (500 mg total) by mouth 3 (three) times daily. 11/29/16   Tommi RumpsSummers, Rhonda L, PA-C  clonazePAM (KLONOPIN) 1 MG tablet Take 1 tablet by mouth 2 (two) times daily. 02/14/16   [provider]  cyclobenzaprine (FLEXERIL) 10 MG tablet Take 1 tablet (10 mg total) by mouth 3 (three) times daily as needed. 02/19/17   Joni ReiningSmith, Vannessa Godown K, PA-C  escitalopram (LEXAPRO) 20 MG tablet Take 1 tablet by mouth daily. 01/17/16   [provider]  gabapentin (NEURONTIN) 300 MG capsule Take 2 capsules by mouth 2 (two) times daily. 04/03/16   [provider]  insulin glargine (LANTUS) 100 UNIT/ML injection Inject into the skin at bedtime.    [provider]  meloxicam (MOBIC) 15 MG tablet Take 1 tablet (15 mg total) by mouth daily. 12/26/16   Cuthriell, Delorise RoyalsJonathan D, PA-C  methocarbamol (ROBAXIN-750) 750 MG tablet Take 2 tablets (1,500 mg total) by mouth 4 (four) times daily. 02/19/17   Joni ReiningSmith, Makaylie Dedeaux K, PA-C  oxyCODONE-acetaminophen (ROXICET) 5-325 MG tablet Take 1 tablet by mouth every 6 (six) hours as needed. 02/19/17 02/19/18  Joni ReiningSmith, Karyl Sharrar K, PA-C  QUEtiapine (SEROQUEL) 400 MG tablet Take 1 tablet by mouth daily. 01/17/16   [provider]  rOPINIRole (REQUIP) 1 MG tablet Take 2-3 tablets by mouth at bedtime. 01/17/16   [provider]  traMADol (ULTRAM) 50 MG tablet Take 1 tablet (50 mg total) by mouth every 6 (six) hours as needed. 12/26/16   Cuthriell, Delorise RoyalsJonathan D, PA-C  traMADol (ULTRAM) 50 MG tablet Take 1 tablet (50 mg total) by mouth every 6 (six) hours as needed for moderate pain. 02/19/17   Joni ReiningSmith, Kaimana Neuzil K, PA-C    Allergies Patient has no known allergies.  No family history on file.  Social History Social History   Tobacco Use  . Smoking status: Passive Smoke Exposure - Never Smoker  . Smokeless tobacco: Never Used  Substance Use Topics  . Alcohol use: Yes  . Drug use: Yes    Types: Marijuana  Review of Systems  Constitutional: No fever/chills Eyes: No visual changes. ENT: No sore throat. Cardiovascular: Denies chest pain. Respiratory: Denies shortness of breath. Gastrointestinal: No abdominal pain.  No nausea, no vomiting.  No diarrhea.  No constipation. Genitourinary: Negative for dysuria. Musculoskeletal: Negative for back pain. Skin: Negative for rash. Neurological: Negative for headaches, focal weakness or numbness. Psychiatric:Alcohol and drug abuse. Recurrent  major depression Endocrine:Diabetes ____________________________________________   PHYSICAL EXAM:  VITAL SIGNS: ED Triage Vitals  Enc Vitals Group     BP 02/19/17 1246 (!) 141/100     Pulse Rate 02/19/17 1246 88     Resp 02/19/17 1246 18     Temp 02/19/17 1246 97.7 F (36.5 C)     Temp Source 02/19/17 1246 Oral     SpO2 02/19/17 1246 99 %     Weight 02/19/17 1247 290 lb (131.5 kg)     Height 02/19/17 1247 5\' 7"  (1.702 m)     Head Circumference --      Peak Flow --      Pain Score 02/19/17 1246 8     Pain Loc --      Pain Edu? --      Excl. in GC? --     Constitutional: Alert and oriented. Moderate distress/tearful. Morbid obesity Neck: No stridor.  No cervical spine tenderness to palpation. Cardiovascular: Normal rate, regular rhythm. Grossly normal heart sounds.  Good peripheral circulation. Respiratory: Normal respiratory effort.  No retractions. Lungs CTAB. Musculoskeletal no obvious deformity although patient admits body habitus limits the exam. There are no spinal deformity. Patient moderate guarding palpation L4-S1. Patient has decreased range of motion with flexion and left lateral movements. Patient has negative straight leg test.  Neurologic:  Normal speech and language. No gross focal neurologic deficits are appreciated. No gait instability. Skin:  Skin is warm, dry and intact. No rash noted. Psychiatric: Mood and affect are normal. Speech and behavior are normal.  ____________________________________________   LABS (all labs ordered are listed, but only abnormal results are displayed)  Labs Reviewed - No data to display ____________________________________________  EKG   ____________________________________________  RADIOLOGY  No results found.  ____________________________________________   PROCEDURES  Procedure(s) performed: None  Procedures  Critical Care performed: No  ____________________________________________   INITIAL IMPRESSION  / ASSESSMENT AND PLAN / ED COURSE  As part of my medical decision making, I reviewed the following data within the electronic MEDICAL RECORD NUMBER Notes from prior ED visits and [place] Controlled Substance Database   Acute lumbar strain. Patient given discharge care instructions. Patient advised to take medication as directed. Advised to follow-up with PCP if condition persists morning 3 days.      ____________________________________________   FINAL CLINICAL IMPRESSION(S) / ED DIAGNOSES  Final diagnoses:  Strain of lumbar region, initial encounter     ED Discharge Orders        Ordered    methocarbamol (ROBAXIN-750) 750 MG tablet  4 times daily     02/19/17 1413    oxyCODONE-acetaminophen (ROXICET) 5-325 MG tablet  Every 6 hours PRN     02/19/17 1413    traMADol (ULTRAM) 50 MG tablet  Every 6 hours PRN     02/19/17 1417    cyclobenzaprine (FLEXERIL) 10 MG tablet  3 times daily PRN     02/19/17 1417       Note:  This document was prepared using Dragon voice recognition software and may include unintentional dictation errors.    Joni ReiningSmith, Lakeidra Reliford K, PA-C  02/19/17 1424    Sharyn Creamer, MD 02/19/17 1734

## 2017-02-21 ENCOUNTER — Encounter: Payer: Self-pay | Admitting: Emergency Medicine

## 2017-02-21 ENCOUNTER — Emergency Department
Admission: EM | Admit: 2017-02-21 | Discharge: 2017-02-21 | Disposition: A | Discharge status: Home / Self Care | Payer: Medicaid Other | Attending: Emergency Medicine | Admitting: Emergency Medicine

## 2017-02-21 DIAGNOSIS — Y929 Unspecified place or not applicable: Secondary | ICD-10-CM | POA: Insufficient documentation

## 2017-02-21 DIAGNOSIS — Z79899 Other long term (current) drug therapy: Secondary | ICD-10-CM | POA: Insufficient documentation

## 2017-02-21 DIAGNOSIS — E119 Type 2 diabetes mellitus without complications: Secondary | ICD-10-CM | POA: Insufficient documentation

## 2017-02-21 DIAGNOSIS — Y999 Unspecified external cause status: Secondary | ICD-10-CM | POA: Insufficient documentation

## 2017-02-21 DIAGNOSIS — S39012A Strain of muscle, fascia and tendon of lower back, initial encounter: Secondary | ICD-10-CM | POA: Insufficient documentation

## 2017-02-21 DIAGNOSIS — Z7722 Contact with and (suspected) exposure to environmental tobacco smoke (acute) (chronic): Secondary | ICD-10-CM | POA: Insufficient documentation

## 2017-02-21 DIAGNOSIS — X509XXA Other and unspecified overexertion or strenuous movements or postures, initial encounter: Secondary | ICD-10-CM | POA: Insufficient documentation

## 2017-02-21 DIAGNOSIS — Y93E5 Activity, floor mopping and cleaning: Secondary | ICD-10-CM | POA: Insufficient documentation

## 2017-02-21 DIAGNOSIS — Z794 Long term (current) use of insulin: Secondary | ICD-10-CM | POA: Insufficient documentation

## 2017-02-21 DIAGNOSIS — J45909 Unspecified asthma, uncomplicated: Secondary | ICD-10-CM | POA: Insufficient documentation

## 2017-02-21 LAB — COMPREHENSIVE METABOLIC PANEL
ALK PHOS: 63 U/L (ref 38–126)
ALT: 16 U/L (ref 14–54)
ANION GAP: 9 (ref 5–15)
AST: 16 U/L (ref 15–41)
Albumin: 3.3 g/dL — ABNORMAL LOW (ref 3.5–5.0)
BUN: 17 mg/dL (ref 6–20)
CALCIUM: 8.7 mg/dL — AB (ref 8.9–10.3)
CO2: 23 mmol/L (ref 22–32)
Chloride: 100 mmol/L — ABNORMAL LOW (ref 101–111)
Creatinine, Ser: 0.7 mg/dL (ref 0.44–1.00)
GFR calc Af Amer: >60 mL/min (ref >60)
GFR calc non Af Amer: >60 mL/min (ref >60)
GLUCOSE: 265 mg/dL — AB (ref 65–99)
POTASSIUM: 4 mmol/L (ref 3.5–5.1)
Sodium: 132 mmol/L — ABNORMAL LOW (ref 135–145)
Total Bilirubin: 0.3 mg/dL (ref 0.3–1.2)
Total Protein: 7.3 g/dL (ref 6.5–8.1)

## 2017-02-21 LAB — POCT PREGNANCY, URINE: PREG TEST UR: NEGATIVE

## 2017-02-21 LAB — ACETAMINOPHEN LEVEL: Acetaminophen (Tylenol), Serum: 14 ug/mL (ref 10–30)

## 2017-02-21 MED ORDER — KETOROLAC TROMETHAMINE 30 MG/ML IJ SOLN
30.0000 mg | Freq: Once | INTRAMUSCULAR | Status: AC
  Administered 2017-02-21: 30 mg via INTRAVENOUS
  Filled 2017-02-21: qty 1

## 2017-02-21 MED ORDER — LIDOCAINE 5 % EX PTCH
1.0000 | MEDICATED_PATCH | CUTANEOUS | Status: DC
  Administered 2017-02-21: 1 via TRANSDERMAL
  Filled 2017-02-21: qty 1

## 2017-02-21 NOTE — ED Notes (Signed)
Patient reports pain started at work, but that she is not going to file under workers comp

## 2017-02-21 NOTE — ED Provider Notes (Signed)
[facility] Emergency Department Provider Note   ____________________________________________   First MD Initiated Contact with Patient 02/21/17 0703     (approximate)  I have reviewed the triage vital signs and the nursing notes.   HISTORY  Chief Complaint Back Pain    HPI Bianca Alvarez is a 31 y.o. female reports no significant medical history of diabetes and previous history of asthma  Patient reports that about 3 days ago, she was doing cleaning, while doing this she was bending and suddenly felt a pop in her lower back.  She came to the ER and was evaluated and given tramadol and methocarbamol, but reports she continues to have significant pain especially when she tries to go to work where she moves around frequently and also when she tries to reach behind her or stand up to walk.  Pain is relieved by sitting still.  No numbness tingling or weakness.  Denies pregnancy, last menstrual period was just less than a week ago.  No nausea or vomiting.  No abdominal pain.  No fevers or chills.  No history of IV drug use.  No fevers.  No changes in bowel or bladder habits  She has also been treating with Tylenol, at times she has taken up to 4-6 Tylenol extra strength tablets at a time over the last 3 days.  She is not associated nausea or vomiting with that.  In total she is probably taking about 20 Tylenol tablets over the last 3 days.  She last took Tylenol at about 4 AM today.   Past Medical History:  Diagnosis Date  . Asthma   . Diabetes mellitus without complication [facility](HCC)     Patient Active Problem List   Diagnosis Date Noted  . 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 Overdose of antipsychotic 04/13/2016  . Alcohol abuse 04/13/2016  . Cocaine abuse (HCC) 04/13/2016    Past Surgical History:  Procedure Laterality Date  . ANKLE FRACTURE SURGERY      Prior to Admission medications   Medication Sig Start  Date End Date Taking? Authorizing Provider  albuterol (PROVENTIL HFA;VENTOLIN HFA) 108 (90 Base) MCG/ACT inhaler Inhale 2 puffs into the lungs every 6 (six) hours as needed for wheezing or shortness of breath. 08/14/16   Joni ReiningSmith, Ronald K, PA-C  cephALEXin (KEFLEX) 500 MG capsule Take 1 capsule (500 mg total) by mouth 3 (three) times daily. 11/29/16   Tommi RumpsSummers, Rhonda L, PA-C  clonazePAM (KLONOPIN) 1 MG tablet Take 1 tablet by mouth 2 (two) times daily. 02/14/16   [provider]  cyclobenzaprine (FLEXERIL) 10 MG tablet Take 1 tablet (10 mg total) by mouth 3 (three) times daily as needed. 02/19/17   Joni ReiningSmith, Ronald K, PA-C  escitalopram (LEXAPRO) 20 MG tablet Take 1 tablet by mouth daily. 01/17/16   [provider]  gabapentin (NEURONTIN) 300 MG capsule Take 2 capsules by mouth 2 (two) times daily. 04/03/16   [provider]  insulin glargine (LANTUS) 100 UNIT/ML injection Inject into the skin at bedtime.    [provider]  meloxicam (MOBIC) 15 MG tablet Take 1 tablet (15 mg total) by mouth daily. 12/26/16   Cuthriell, Delorise RoyalsJonathan D, PA-C  methocarbamol (ROBAXIN-750) 750 MG tablet Take 2 tablets (1,500 mg total) by mouth 4 (four) times daily. 02/19/17   Joni ReiningSmith, Ronald K, PA-C  oxyCODONE-acetaminophen (ROXICET) 5-325 MG tablet Take 1 tablet by mouth every 6 (six) hours as needed. 02/19/17 02/19/18  Nona DellSmith, Ronald  K, PA-C  QUEtiapine (SEROQUEL) 400 MG tablet Take 1 tablet by mouth daily. 01/17/16   [provider]  rOPINIRole (REQUIP) 1 MG tablet Take 2-3 tablets by mouth at bedtime. 01/17/16   [provider]  traMADol (ULTRAM) 50 MG tablet Take 1 tablet (50 mg total) by mouth every 6 (six) hours as needed. 12/26/16   Cuthriell, Delorise Royals, PA-C  traMADol (ULTRAM) 50 MG tablet Take 1 tablet (50 mg total) by mouth every 6 (six) hours as needed for moderate pain. 02/19/17   Joni Reining, PA-C    Allergies Patient has no known allergies.  No family history on  file.  Social History Social History   Tobacco Use  . Smoking status: Passive Smoke Exposure - Never Smoker  . Smokeless tobacco: Never Used  Substance Use Topics  . Alcohol use: No    Frequency: Never  . Drug use: No    Review of Systems Constitutional: No fever/chills. Eyes: No visual changes. ENT: No sore throat. Cardiovascular: Denies chest pain. Respiratory: Denies shortness of breath. Gastrointestinal: No abdominal pain.  No nausea, no vomiting.  No diarrhea.  No constipation. Genitourinary: Negative for dysuria. Musculoskeletal: See HPI.  No weakness in the arms.  No neck pain or middle back pain. Skin: Negative for rash. Neurological: Negative for headaches, focal weakness or numbness.    ____________________________________________   PHYSICAL EXAM:  VITAL SIGNS: ED Triage Vitals  Enc Vitals Group     BP 02/21/17 0600 118/78     Pulse Rate 02/21/17 0559 71     Resp 02/21/17 0559 18     Temp 02/21/17 0559 97.6 F (36.4 C)     Temp Source 02/21/17 0559 Oral     SpO2 02/21/17 0559 100 %     Weight 02/21/17 0559 290 lb (131.5 kg)     Height 02/21/17 0559 5\' 7"  (1.702 m)     Head Circumference --      Peak Flow --      Pain Score 02/21/17 0559 7     Pain Loc --      Pain Edu? --      Excl. in GC? --     Constitutional: Alert and oriented. Well appearing and in no acute distress.  She is here with a friend. Eyes: Conjunctivae are normal. Head: Atraumatic. Nose: No congestion/rhinnorhea. Mouth/Throat: Mucous membranes are moist. Neck: No stridor.   Cardiovascular: Normal rate, regular rhythm. Grossly normal heart sounds.  Good peripheral circulation. Respiratory: Normal respiratory effort.  No retractions. Lungs CTAB. Gastrointestinal: Soft and nontender. No distention. Musculoskeletal: No lower extremity tenderness nor edema.  No cervical or thoracic tenderness.  On palpation of lower lumbar spine she has midline lower lumbar tenderness and also  tenderness about the mid lumbar paraspinous muscles.  When she tries to sit up or twist to the left to the right or bend forward she reports significant pain.  There is no saddle anesthesia. Neurologic:  Normal speech and language. No gross focal neurologic deficits are appreciated.  Patient has normal patellar reflexes bilateral.  Normal strength in the lower extremities bilaterally.  No decrease in sensation in lower extremities bilateral. Skin:  Skin is warm, dry and intact. No rash noted. Psychiatric: Mood and affect are normal. Speech and behavior are normal.  ____________________________________________   LABS (all labs ordered are listed, but only abnormal results are displayed)  Labs Reviewed  COMPREHENSIVE METABOLIC PANEL - Abnormal; Notable for the following components:  Result Value   Sodium 132 (*)    Chloride 100 (*)    Glucose, Bld 265 (*)    Calcium 8.7 (*)    Albumin 3.3 (*)    All other components within normal limits  ACETAMINOPHEN LEVEL  POCT PREGNANCY, URINE  POC URINE PREG, ED   ____________________________________________  EKG   ____________________________________________  RADIOLOGY  No back pain red flags, however given this is her second visit to the ER for same in the last few days and will obtain a x-ray to evaluate assure no bony abnormality or small compression fracture etc. ____________________________________________   PROCEDURES  Procedure(s) performed: None  Procedures  Critical Care performed: No  ____________________________________________   INITIAL IMPRESSION / ASSESSMENT AND PLAN / ED COURSE  Pertinent labs & imaging results that were available during my care of the patient were reviewed by me and considered in my medical decision making (see chart for details).  Patient returns for evaluation of low back pain.  Appears likely consistent with a mild lumbar strain, feel far less likely represent radicular etiology or slipped  disc given the pain does not radiate.  No associated symptoms to suggest cauda equina.  No back pain red flags suggest the need for MRI or CT imaging.  We will treat her here with Toradol.  She is a diabetic, thus I will not treat her with a steroid taper at this time.  I will send hepatic function test as well as acetaminophen level, patient reports that she is at times taking 4-6 Tylenol extra strength tablets at a time exceeding the normal therapeutic window.  She says no signs or symptoms of hepatic injury, but I did advise her on careful use and using no more than 1 g of Tylenol every 6 hours.  She is agreeable to this plan and did not let her know that she could also be taking ibuprofen, which I encouraged her to use over-the-counter as well for her back pain.    Clinical Course as of Feb 21 937  Wynelle LinkSun Feb 21, 2017  0805 After discussion, patient has requested not to have her back x-ray performed.  She would like to forego this, I think this is reasonable in the assured medical decision making we will discontinue this she has no red flags for low back pain, normal neurologic exam, and a history that seems consistent with musculoskeletal etiology  [MQ]    Clinical Course User Index [MQ] Sharyn CreamerQuale, Braelee Herrle, MD    Return precautions and treatment recommendations and follow-up discussed with the patient who is agreeable with the plan.  Discussed specific especially red flags related to low back pain with regard to return precautions.  I offered the patient prescription for Lidoderm patches, and discussed that she needs [facility].  She reports that she does not currently have insurance, she is able [facility], but cannot obtain Lidoderm patch at this time he did not was prescription  Also counseled on compliance with her diabetes medicine and close [facility], she is agreeable. ____________________________________________   FINAL CLINICAL  IMPRESSION(S) / ED DIAGNOSES  Final diagnoses:  Lumbar strain, initial encounter      NEW MEDICATIONS STARTED DURING THIS VISIT:  This SmartLink is deprecated. Use AVSMEDLIST instead to display the medication list for a patient.   Note:  This document was prepared using Dragon voice recognition software and may include unintentional dictation errors.     Sharyn CreamerQuale, Sheneika Walstad, MD 02/21/17  0938  

## 2017-02-21 NOTE — ED Triage Notes (Signed)
Patient states that she was working Thursday and bent down to pick up something and hurt her back. Patient states that she was seen here on Friday and diagnosed with a lumbar strain. Patient states that she has been taking flexeril and tramadol with no improvement. Patient states that she was unable to work today due to the pain.

## 2017-02-21 NOTE — Discharge Instructions (Signed)

## 2018-01-10 DIAGNOSIS — Z91411 Personal history of adult psychological abuse: Secondary | ICD-10-CM | POA: Insufficient documentation

## 2018-05-09 ENCOUNTER — Other Ambulatory Visit: Payer: Self-pay

## 2018-05-09 ENCOUNTER — Emergency Department: Payer: Medicaid Other

## 2018-05-09 ENCOUNTER — Encounter: Payer: Self-pay | Admitting: Emergency Medicine

## 2018-05-09 ENCOUNTER — Emergency Department
Admission: EM | Admit: 2018-05-09 | Discharge: 2018-05-09 | Disposition: A | Discharge status: Home / Self Care | Payer: Medicaid Other | Attending: Emergency Medicine | Admitting: Emergency Medicine

## 2018-05-09 DIAGNOSIS — Y998 Other external cause status: Secondary | ICD-10-CM | POA: Insufficient documentation

## 2018-05-09 DIAGNOSIS — Z7722 Contact with and (suspected) exposure to environmental tobacco smoke (acute) (chronic): Secondary | ICD-10-CM | POA: Insufficient documentation

## 2018-05-09 DIAGNOSIS — E119 Type 2 diabetes mellitus without complications: Secondary | ICD-10-CM | POA: Insufficient documentation

## 2018-05-09 DIAGNOSIS — Y9389 Activity, other specified: Secondary | ICD-10-CM | POA: Insufficient documentation

## 2018-05-09 DIAGNOSIS — Z79899 Other long term (current) drug therapy: Secondary | ICD-10-CM | POA: Insufficient documentation

## 2018-05-09 DIAGNOSIS — J45909 Unspecified asthma, uncomplicated: Secondary | ICD-10-CM | POA: Insufficient documentation

## 2018-05-09 DIAGNOSIS — S62655A Nondisplaced fracture of medial phalanx of left ring finger, initial encounter for closed fracture: Secondary | ICD-10-CM | POA: Insufficient documentation

## 2018-05-09 DIAGNOSIS — Z794 Long term (current) use of insulin: Secondary | ICD-10-CM | POA: Insufficient documentation

## 2018-05-09 DIAGNOSIS — Y92019 Unspecified place in single-family (private) house as the place of occurrence of the external cause: Secondary | ICD-10-CM | POA: Insufficient documentation

## 2018-05-09 DIAGNOSIS — W228XXA Striking against or struck by other objects, initial encounter: Secondary | ICD-10-CM | POA: Insufficient documentation

## 2018-05-09 MED ORDER — MELOXICAM 15 MG PO TABS: 15.0000 mg | ORAL_TABLET | Freq: Every day | ORAL | 0 refills | Status: DC

## 2018-05-09 MED ORDER — MELOXICAM 7.5 MG PO TABS
15.0000 mg | ORAL_TABLET | Freq: Once | ORAL | Status: AC
Start: 2018-05-09 — End: 2018-05-09
  Administered 2018-05-09: 15 mg via ORAL
  Filled 2018-05-09: qty 2

## 2018-05-09 NOTE — ED Provider Notes (Signed)
[facility] Emergency Department Provider Note  ____________________________________________  Time seen: Approximately 8:51 PM  I have reviewed the triage vital signs and the nursing notes.   HISTORY  Chief Complaint Finger Injury    HPI Bianca Alvarez is a 33 y.o. female who presents the emergency department complaining of pain to the fourth digit of the left hand.  Patient reports that she was at her home, accidentally struck her finger on a wooden object.  Initially, patient had minimal pain but throughout the day she has had increased edema and pain to the PIP joint.  Patient denies any other injury or complaint.  No history of previous injuries to the left hand.    Past Medical History:  Diagnosis Date  . Asthma   . Diabetes mellitus without complication [facility])     Patient Active Problem List   Diagnosis Date Noted  . 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 Overdose of antipsychotic 04/13/2016  . Alcohol abuse 04/13/2016  . Cocaine abuse (HCC) 04/13/2016    Past Surgical History:  Procedure Laterality Date  . ANKLE FRACTURE SURGERY      Prior to Admission medications   Medication Sig Start Date End Date Taking? Authorizing Provider  albuterol (PROVENTIL HFA;VENTOLIN HFA) 108 (90 Base) MCG/ACT inhaler Inhale 2 puffs into the lungs every 6 (six) hours as needed for wheezing or shortness of breath. 08/14/16   Joni Reining, PA-C  cephALEXin (KEFLEX) 500 MG capsule Take 1 capsule (500 mg total) by mouth 3 (three) times daily. 11/29/16   Tommi Rumps, PA-C  clonazePAM (KLONOPIN) 1 MG tablet Take 1 tablet by mouth 2 (two) times daily. 02/14/16   [provider]  cyclobenzaprine (FLEXERIL) 10 MG tablet Take 1 tablet (10 mg total) by mouth 3 (three) times daily as needed. 02/19/17   Joni Reining, PA-C  escitalopram (LEXAPRO) 20 MG tablet Take 1 tablet by mouth daily. 01/17/16   [provider]  gabapentin (NEURONTIN) 300 MG capsule Take 2 capsules by mouth 2 (two) times daily. 04/03/16   [provider]  insulin glargine (LANTUS) 100 UNIT/ML injection Inject into the skin at bedtime.    [provider]  meloxicam (MOBIC) 15 MG tablet Take 1 tablet (15 mg total) by mouth daily. 05/09/18   , Delorise Royals, PA-C  methocarbamol (ROBAXIN-750) 750 MG tablet Take 2 tablets (1,500 mg total) by mouth 4 (four) times daily. 02/19/17   Joni Reining, PA-C  QUEtiapine (SEROQUEL) 400 MG tablet Take 1 tablet by mouth daily. 01/17/16   [provider]  rOPINIRole (REQUIP) 1 MG tablet Take 2-3 tablets by mouth at bedtime. 01/17/16   [provider]  traMADol (ULTRAM) 50 MG tablet Take 1 tablet (50 mg total) by mouth every 6 (six) hours as needed. 12/26/16   , Delorise Royals, PA-C  traMADol (ULTRAM) 50 MG tablet Take 1 tablet (50 mg total) by mouth every 6 (six) hours as needed for moderate pain. 02/19/17   Joni Reining, PA-C    Allergies Patient has no known allergies.  History reviewed. No pertinent family history.  Social History Social History   Tobacco Use  . Smoking status: Passive Smoke Exposure - Never Smoker  . Smokeless tobacco: Never Used  Substance Use Topics  . Alcohol use: No    Frequency: Never  . Drug use: No     Review of Systems  Constitutional: No fever/chills Eyes: No visual changes. Cardiovascular:  no chest pain. Respiratory: no cough. No SOB. Gastrointestinal: No abdominal pain.  No nausea, no vomiting.   Musculoskeletal: Positive for pain/injury to the fourth digit left hand. Skin: Negative for rash, abrasions, lacerations, ecchymosis. Neurological: Negative for headaches, focal weakness or numbness. 10-point ROS otherwise negative.  ____________________________________________   PHYSICAL EXAM:  VITAL SIGNS: ED Triage Vitals  Enc Vitals Group     BP 05/09/18 1905 (!) 115/100     Pulse Rate  05/09/18 1905 78     Resp 05/09/18 1905 18     Temp 05/09/18 1905 98.3 F (36.8 C)     Temp Source 05/09/18 1905 Oral     SpO2 05/09/18 1905 100 %     Weight 05/09/18 1906 290 lb (131.5 kg)     Height 05/09/18 1906 5\' 6"  (1.676 m)     Head Circumference --      Peak Flow --      Pain Score 05/09/18 1911 6     Pain Loc --      Pain Edu? --      Excl. in GC? --      Constitutional: Alert and oriented. Well appearing and in no acute distress. Eyes: Conjunctivae are normal. PERRL. EOMI. Head: Atraumatic. Neck: No stridor.    Cardiovascular: Normal rate, regular rhythm. Normal S1 and S2.  Good peripheral circulation. Respiratory: Normal respiratory effort without tachypnea or retractions. Lungs CTAB. Good air entry to the bases with no decreased or absent breath sounds. Musculoskeletal: Full range of motion to all extremities. No gross deformities appreciated.  Visualization of the fourth digit left hand reveals mild edema around the PIP joint.  No deformity.  Good range of motion to the fourth digit left hand.  Sensation and cap refill intact.  Patient is tender to palpation of the PIP joint with no tenderness to palpation over the osseous structures of the fourth digit left hand. Neurologic:  Normal speech and language. No gross focal neurologic deficits are appreciated.  Skin:  Skin is warm, dry and intact. No rash noted. Psychiatric: Mood and affect are normal. Speech and behavior are normal. Patient exhibits appropriate insight and judgement.   ____________________________________________   LABS (all labs ordered are listed, but only abnormal results are displayed)  Labs Reviewed - No data to display ____________________________________________  EKG   ____________________________________________  RADIOLOGY I personally viewed and evaluated these images as part of my medical decision making, as well as reviewing the written report by the radiologist.  Dg Finger Ring  Left  Result Date: 05/09/2018 CLINICAL DATA:  33 year old female with a history of injury EXAM: LEFT RING FINGER 2+V COMPARISON:  None. FINDINGS: Lateral view demonstrates acute fracture at the volar base of the second phalanx of the fourth digit left hand with associated soft tissue swelling. No radiopaque foreign body. IMPRESSION: Fracture at the volar base of the second phalanx of the fourth digit right hand with associated soft tissue swelling Electronically Signed   By: Gilmer MorJaime  Wagner D.O.   On: 05/09/2018 19:43    ____________________________________________    PROCEDURES  Procedure(s) performed:    Procedures    Medications  meloxicam (MOBIC) tablet 15 mg (has no administration in time range)     ____________________________________________   INITIAL IMPRESSION / ASSESSMENT AND PLAN / ED COURSE  Pertinent labs & imaging results that were available during my care of the patient were reviewed by me and considered in my medical decision making (see chart for details).  Review of  the [place] CSRS was performed in accordance of the [facility] prior to dispensing any controlled drugs.      Patient's diagnosis is consistent with nondisplaced fracture of the middle phalanx of the left ring finger.  Patient presents emergency department with left ring finger pain after injury.  X-ray reveals small avulsion type fracture of the middle phalanx.  Fingers are buddy taped.  Meloxicam for symptom improvement.  Follow-up primary care as needed..  Patient is given ED precautions to return to the ED for any worsening or new symptoms.     ____________________________________________  FINAL CLINICAL IMPRESSION(S) / ED DIAGNOSES  Final diagnoses:  Closed nondisplaced fracture of middle phalanx of left ring finger, initial encounter      NEW MEDICATIONS STARTED DURING THIS VISIT:  ED Discharge Orders         Ordered    meloxicam (MOBIC) 15 MG tablet  Daily     05/09/18 2108               This chart was dictated using voice recognition software/Dragon. Despite best efforts to proofread, errors can occur which can change the meaning. Any change was purely unintentional.    Racheal Patches, PA-C 05/09/18 2146    Jeanmarie Plant, MD 05/09/18 2348

## 2018-05-09 NOTE — ED Triage Notes (Signed)
Pt presents to ED with c/o 4th digit injury to L hand. Pt states she injured her finger earlier this morning.

## 2018-08-24 LAB — HM HIV SCREENING LAB: HM HIV Screening: NEGATIVE

## 2018-11-16 ENCOUNTER — Emergency Department: Payer: No Typology Code available for payment source

## 2018-11-16 ENCOUNTER — Emergency Department
Admission: EM | Admit: 2018-11-16 | Discharge: 2018-11-16 | Disposition: A | Discharge status: Home / Self Care | Payer: No Typology Code available for payment source | Attending: Student in an Organized Health Care Education/Training Program | Admitting: Student in an Organized Health Care Education/Training Program

## 2018-11-16 ENCOUNTER — Other Ambulatory Visit: Payer: Self-pay

## 2018-11-16 ENCOUNTER — Encounter: Payer: Self-pay | Admitting: Emergency Medicine

## 2018-11-16 DIAGNOSIS — Z7722 Contact with and (suspected) exposure to environmental tobacco smoke (acute) (chronic): Secondary | ICD-10-CM | POA: Insufficient documentation

## 2018-11-16 DIAGNOSIS — J45909 Unspecified asthma, uncomplicated: Secondary | ICD-10-CM | POA: Diagnosis not present

## 2018-11-16 DIAGNOSIS — Y92810 Car as the place of occurrence of the external cause: Secondary | ICD-10-CM | POA: Insufficient documentation

## 2018-11-16 DIAGNOSIS — M25512 Pain in left shoulder: Secondary | ICD-10-CM

## 2018-11-16 DIAGNOSIS — Y999 Unspecified external cause status: Secondary | ICD-10-CM | POA: Diagnosis not present

## 2018-11-16 DIAGNOSIS — Y9389 Activity, other specified: Secondary | ICD-10-CM | POA: Insufficient documentation

## 2018-11-16 DIAGNOSIS — E119 Type 2 diabetes mellitus without complications: Secondary | ICD-10-CM | POA: Diagnosis not present

## 2018-11-16 DIAGNOSIS — Z794 Long term (current) use of insulin: Secondary | ICD-10-CM | POA: Insufficient documentation

## 2018-11-16 DIAGNOSIS — Z79899 Other long term (current) drug therapy: Secondary | ICD-10-CM | POA: Insufficient documentation

## 2018-11-16 MED ORDER — HYDROCODONE-ACETAMINOPHEN 5-325 MG PO TABS: 1.0000 | ORAL_TABLET | Freq: Four times a day (QID) | ORAL | 0 refills | Status: DC | PRN
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
## 2018-11-16 NOTE — Discharge Instructions (Signed)
Follow-up with your primary care provider if any continued problems.  Ice to your shoulder as needed for discomfort.  Wear your sling for support as needed.  Continue taking ibuprofen 800 mg 3 times daily with food.  A prescription for Norco was sent to your pharmacy.  This is a narcotic and you should not drive while taking this medication.

## 2019-04-29 ENCOUNTER — Other Ambulatory Visit: Payer: Self-pay

## 2019-04-29 ENCOUNTER — Emergency Department: Payer: Medicaid Other

## 2019-04-29 ENCOUNTER — Encounter: Payer: Self-pay | Admitting: Intensive Care

## 2019-04-29 ENCOUNTER — Emergency Department
Admission: EM | Admit: 2019-04-29 | Discharge: 2019-04-29 | Disposition: A | Discharge status: Home / Self Care | Payer: Medicaid Other | Attending: Student | Admitting: Student

## 2019-04-29 DIAGNOSIS — Z3A01 Less than 8 weeks gestation of pregnancy: Secondary | ICD-10-CM | POA: Diagnosis not present

## 2019-04-29 DIAGNOSIS — L03311 Cellulitis of abdominal wall: Secondary | ICD-10-CM | POA: Insufficient documentation

## 2019-04-29 DIAGNOSIS — R102 Pelvic and perineal pain: Secondary | ICD-10-CM | POA: Diagnosis not present

## 2019-04-29 DIAGNOSIS — O24111 Pre-existing diabetes mellitus, type 2, in pregnancy, first trimester: Secondary | ICD-10-CM | POA: Insufficient documentation

## 2019-04-29 DIAGNOSIS — O99511 Diseases of the respiratory system complicating pregnancy, first trimester: Secondary | ICD-10-CM | POA: Diagnosis not present

## 2019-04-29 DIAGNOSIS — Z79899 Other long term (current) drug therapy: Secondary | ICD-10-CM | POA: Insufficient documentation

## 2019-04-29 DIAGNOSIS — Z794 Long term (current) use of insulin: Secondary | ICD-10-CM | POA: Insufficient documentation

## 2019-04-29 DIAGNOSIS — O99891 Other specified diseases and conditions complicating pregnancy: Secondary | ICD-10-CM | POA: Insufficient documentation

## 2019-04-29 DIAGNOSIS — Z7722 Contact with and (suspected) exposure to environmental tobacco smoke (acute) (chronic): Secondary | ICD-10-CM | POA: Diagnosis not present

## 2019-04-29 DIAGNOSIS — E119 Type 2 diabetes mellitus without complications: Secondary | ICD-10-CM | POA: Diagnosis not present

## 2019-04-29 DIAGNOSIS — O26899 Other specified pregnancy related conditions, unspecified trimester: Secondary | ICD-10-CM

## 2019-04-29 DIAGNOSIS — J45909 Unspecified asthma, uncomplicated: Secondary | ICD-10-CM | POA: Insufficient documentation

## 2019-04-29 LAB — COMPREHENSIVE METABOLIC PANEL
ALT: 17 U/L (ref 0–44)
AST: 20 U/L (ref 15–41)
Albumin: 4 g/dL (ref 3.5–5.0)
Alkaline Phosphatase: 57 U/L (ref 38–126)
Anion gap: 13 (ref 5–15)
BUN: 10 mg/dL (ref 6–20)
CO2: 22 mmol/L (ref 22–32)
Calcium: 9.2 mg/dL (ref 8.9–10.3)
Chloride: 99 mmol/L (ref 98–111)
Creatinine, Ser: 0.6 mg/dL (ref 0.44–1.00)
GFR calc Af Amer: >60 mL/min (ref >60)
GFR calc non Af Amer: >60 mL/min (ref >60)
Glucose, Bld: 257 mg/dL — ABNORMAL HIGH (ref 70–99)
Potassium: 3.9 mmol/L (ref 3.5–5.1)
Sodium: 134 mmol/L — ABNORMAL LOW (ref 135–145)
Total Bilirubin: 0.3 mg/dL (ref 0.3–1.2)
Total Protein: 8.1 g/dL (ref 6.5–8.1)

## 2019-04-29 LAB — CBC
HCT: 41.1 % (ref 36.0–46.0)
Hemoglobin: 13.1 g/dL (ref 12.0–15.0)
MCH: 26.1 pg (ref 26.0–34.0)
MCHC: 31.9 g/dL (ref 30.0–36.0)
MCV: 82 fL (ref 80.0–100.0)
Platelets: 384 10*3/uL (ref 150–400)
RBC: 5.01 MIL/uL (ref 3.87–5.11)
RDW: 15.3 % (ref 11.5–15.5)
WBC: 13.5 10*3/uL — ABNORMAL HIGH (ref 4.0–10.5)
nRBC: 0 % (ref 0.0–0.2)

## 2019-04-29 LAB — URINALYSIS, COMPLETE (UACMP) WITH MICROSCOPIC
Bilirubin Urine: NEGATIVE
Glucose, UA: >=500 mg/dL — AB
Hgb urine dipstick: NEGATIVE
Ketones, ur: NEGATIVE mg/dL
Nitrite: NEGATIVE
Protein, ur: NEGATIVE mg/dL
Specific Gravity, Urine: 1.029 (ref 1.005–1.030)
pH: 6 (ref 5.0–8.0)

## 2019-04-29 LAB — WET PREP, GENITAL
Clue Cells Wet Prep HPF POC: NONE SEEN
Sperm: NONE SEEN
Trich, Wet Prep: NONE SEEN
Yeast Wet Prep HPF POC: NONE SEEN

## 2019-04-29 LAB — POCT PREGNANCY, URINE: Preg Test, Ur: POSITIVE — AB

## 2019-04-29 LAB — LIPASE, BLOOD: Lipase: 48 U/L (ref 11–51)

## 2019-04-29 LAB — HCG, QUANTITATIVE, PREGNANCY: hCG, Beta Chain, Quant, S: 5897 m[IU]/mL — ABNORMAL HIGH (ref <5)

## 2019-04-29 MED ORDER — MUPIROCIN 2 % EX OINT: TOPICAL_OINTMENT | CUTANEOUS | 0 refills | Status: DC

## 2019-04-29 MED ORDER — CLINDAMYCIN HCL 150 MG PO CAPS: 450.0000 mg | ORAL_CAPSULE | Freq: Three times a day (TID) | ORAL | 0 refills | Status: AC

## 2019-04-29 NOTE — Discharge Instructions (Addendum)
Please follow-up with your [facility] OB/GYN doctors.  If needed, below is information for several with OB/GYN clinics in this area.  Please take your antibiotics for your skin infection as directed.  Sometimes this medication can give you loose stool, in this case, we advise purchasing and taking an over-the-counter probiotic when on this medication.  Please also start taking a prenatal vitamin.   Please return to the emergency department for any new or worsening symptoms.

## 2019-04-29 NOTE — ED Provider Notes (Signed)
[facility] Emergency Department Provider Note  ____________________________________________   First MD Initiated Contact with Patient 04/29/19 1527     (approximate)  I have reviewed the triage vital signs and the nursing notes.  History  Chief Complaint Pelvic Pain and Recurrent Skin Infections    HPI Bianca Alvarez is a 34 y.o. female G3P0202 who presents for 2 separate reasons.  First, she is concerned that she has a small area of skin infection to her left lower abdominal area.  Patient states she occasionally shaves her abdomen due to hair, and about a week ago she developed a small area of irritation and redness.  This area is very sensitive and tender to touch.  No drainage.  No significant spreading of the erythema, and in fact she states the area today looks improved compared to yesterday.  No fevers or vomiting.  She does report a remote history of MRSA skin infection in the past.  Second, she presents for intermittent pelvic cramping.  The symptoms have been going on for the last several weeks.  Moderate in severity.  She likens this to feeling similar to menstrual cramps.  No radiation.  No alleviating or aggravating components.  Cramping is intermittent, with no identifiable trigger.  Took several pregnancy tests at home that were both positive and negative. LMP between 12/21-12/29. States this is an unplanned pregnancy, unsure of how she feels about it yet.  She denies any vaginal discharge or concern for STD exposure, however she does ask for STD screening.  Over the last several weeks, she has experienced some vaginal bleeding after intercourse, which she describes as a small amount of spotting, but no other bleeding outside of this.  She denies any vaginal bleeding currently.  She denies any dysuria, hematuria, malodorous urine.   Past Medical Hx Past Medical History:  Diagnosis Date  . Asthma   . Diabetes mellitus without complication [facility])       Problem List Patient Active Problem List   Diagnosis Date Noted  . 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 Overdose of antipsychotic 04/13/2016  . Alcohol abuse 04/13/2016  . Cocaine abuse (HCC) 04/13/2016    Past Surgical Hx Past Surgical History:  Procedure Laterality Date  . ANKLE FRACTURE SURGERY      Medications Prior to Admission medications   Medication Sig Start Date End Date Taking? Authorizing Provider  albuterol (PROVENTIL HFA;VENTOLIN HFA) 108 (90 Base) MCG/ACT inhaler Inhale 2 puffs into the lungs every 6 (six) hours as needed for wheezing or shortness of breath. 08/14/16   Joni Reining, PA-C  clonazePAM (KLONOPIN) 1 MG tablet Take 1 tablet by mouth 2 (two) times daily. 02/14/16   [provider]  escitalopram (LEXAPRO) 20 MG tablet Take 1 tablet by mouth daily. 01/17/16   [provider]  gabapentin (NEURONTIN) 300 MG capsule Take 2 capsules by mouth 2 (two) times daily. 04/03/16   [provider]  HYDROcodone-acetaminophen (NORCO/VICODIN) 5-325 MG tablet Take 1 tablet by mouth every 6 (six) hours as needed for moderate pain. 11/16/18   Tommi Rumps, PA-C  insulin glargine (LANTUS) 100 UNIT/ML injection Inject into the skin at bedtime.    [provider]  QUEtiapine (SEROQUEL) 400 MG tablet Take 1 tablet by mouth daily. 01/17/16   [provider]  rOPINIRole (REQUIP) 1 MG tablet Take 2-3 tablets by mouth at bedtime. 01/17/16   [provider]    Allergies Patient has no  known allergies.  Family Hx History reviewed. No pertinent family history.  Social Hx Social History   Tobacco Use  . Smoking status: Passive Smoke Exposure - Never Smoker  . Smokeless tobacco: Never Used  Substance Use Topics  . Alcohol use: Yes  . Drug use: Yes    Types: Marijuana     Review of Systems  Constitutional: Negative for fever, chills. Eyes: Negative for visual  changes. ENT: Negative for sore throat. Cardiovascular: Negative for chest pain. Respiratory: Negative for shortness of breath. Gastrointestinal: Negative for nausea, vomiting.  Genitourinary: Negative for dysuria.  Positive for pelvic cramping. Musculoskeletal: Negative for leg swelling. Skin: Positive for skin infection of abdominal wall. Neurological: Negative for headaches.   Physical Exam  Vital Signs: ED Triage Vitals  Enc Vitals Group     BP 04/29/19 1402 (!) 148/89     Pulse Rate 04/29/19 1402 91     Resp 04/29/19 1402 18     Temp 04/29/19 1402 98 F (36.7 C)     Temp Source 04/29/19 1402 Oral     SpO2 04/29/19 1402 100 %     Weight 04/29/19 1403 270 lb (122.5 kg)     Height 04/29/19 1403 5\' 6"  (1.676 m)     Head Circumference --      Peak Flow --      Pain Score 04/29/19 1403 6     Pain Loc --      Pain Edu? --      Excl. in GC? --     Constitutional: Alert and oriented.  Head: Normocephalic. Atraumatic. Eyes: Conjunctivae clear. Sclera anicteric. Nose: No congestion. No rhinorrhea. Mouth/Throat: Wearing mask.  Neck: No stridor.   Cardiovascular: Normal rate, regular rhythm. Extremities well perfused. Respiratory: Normal respiratory effort.  Lungs CTAB. Gastrointestinal: Soft. Non-tender. Non-distended.  Pelvic: RN chaperone present.  Normal external genitalia.  No rashes or lesions.  Small amount of thin white discharge in the vaginal canal.  Cervix appears normal, no erythema or friability.  No CMT. Musculoskeletal: No lower extremity edema. No deformities. Neurologic:  Normal speech and language. No gross focal neurologic deficits are appreciated.  Skin: 2 x 1 cm circular area of erythema, mild induration, associated tenderness to the left lower abdominal wall.  Very small amount of spreading erythema from the central area.  No associated drainage.  No fluctuance.  No crepitance. Psychiatric: Mood and affect are appropriate for  situation.    Radiology  06/27/19: IMPRESSION:  Five week 5 day intrauterine pregnancy. Fetal heart rate 104 beats  per minute. No acute maternal findings.    Procedures  Procedure(s) performed (including critical care):  Procedures   Initial Impression / Assessment and Plan / ED Course  34 y.o. female who presents to the ED for small area of localized cellulitis to the left lower abdominal wall, as well as pelvic cramping in the setting of early pregnancy.  With regards to her skin infection, exam is consistent with a small area of localized cellulitis.  No associated abscess.  She does report a history of MRSA previously. Will plan to treat with clindamycin.  With regards to her pelvic cramping in early pregnancy, ultrasound reveals intrauterine pregnancy with positive fetal heart rate. Wet prep negative. Urine with rare bacteria, patient asymptomatic. Sent for culture, if positive would opt to treat given pregnant status.   Otherwise, patient stable for discharge with outpatient follow-up.  Rx for clindamycin topical antibiotic ointment given.  Patient voices understanding and is comfortable  to plan and discharge.  Given return precautions.  Patient states she will follow up with her [facility] OB.   Final Clinical Impression(s) / ED Diagnosis  Final diagnoses:  Cellulitis, abdominal wall  Less than [redacted] weeks gestation of pregnancy  Pelvic cramping       Note:  This document was prepared using Dragon voice recognition software and may include unintentional dictation errors.   Lilia Pro., MD 04/29/19 [phone]

## 2019-04-29 NOTE — ED Triage Notes (Signed)
Patient c/o pelvic pain that started X2-3 weeks ago. Reports pain has gotten progressively worse which is what brought her in today. Reports spotting after intercourse and cramping. Also has noted boil on left lower abdomen with redness surrounding area. Last menstrual cycle started March 14, 2019

## 2019-04-30 LAB — HIV ANTIBODY (ROUTINE TESTING W REFLEX): HIV Screen 4th Generation wRfx: NONREACTIVE

## 2019-04-30 LAB — RPR: RPR Ser Ql: NONREACTIVE

## 2019-05-01 LAB — URINE CULTURE: Culture: 30000 — AB

## 2019-05-02 ENCOUNTER — Telehealth: Payer: Self-pay

## 2019-05-02 NOTE — Telephone Encounter (Signed)
Patient is on gabapentin and is pregnant. She is unsure of whether it is safe for pregnancy to continue taking. Could you please advise this patient?  Thank you, Ardelle Lesches

## 2019-05-02 NOTE — Telephone Encounter (Signed)
Spoke with patient and she is taking gabapentin for diabetic neuropathy. If it is not safe place advise a way the to taper.

## 2019-05-03 ENCOUNTER — Telehealth: Payer: Self-pay | Admitting: Obstetrics and Gynecology

## 2019-05-03 DIAGNOSIS — Z91411 Personal history of adult psychological abuse: Secondary | ICD-10-CM

## 2019-05-03 NOTE — Telephone Encounter (Signed)
See other message

## 2019-05-03 NOTE — Telephone Encounter (Signed)
Pt called in yesterday the pt is on gabapentin. The pt is considered because she is coming in for a + preg conformation. Pt is requesting a call back from the nurse. Please advise

## 2019-05-03 NOTE — Telephone Encounter (Signed)
Notified patient of Dr. Evans message. Patient verbalized understanding.  

## 2019-05-04 LAB — GC/CHLAMYDIA PROBE AMP
Chlamydia trachomatis, NAA: POSITIVE — AB
Neisseria Gonorrhoeae by PCR: NEGATIVE

## 2019-05-05 ENCOUNTER — Telehealth: Payer: Self-pay | Admitting: Emergency Medicine

## 2019-05-05 NOTE — Telephone Encounter (Signed)
Called patient to inform of positive chlamydia test and need for treatment.  Per Dr. Roxan Hockey need to call in azithromycin 1 gram for patient.  I called the med to [facility] [place] at her request. I explained partner treatment and that [facility] does free treatment.  I also asked her to let her obgyn know about this.

## 2019-05-08 ENCOUNTER — Telehealth: Payer: Self-pay | Admitting: Obstetrics and Gynecology

## 2019-05-08 NOTE — Telephone Encounter (Signed)
The pt called in and wanted to know if she would have a co pay for her visit she has family medicaid. The pt doesn't have the 50 for a co pay for her visit. I called back and asked crystal what can we do she stated taking half. The pt is okay with paying half.

## 2019-05-09 ENCOUNTER — Ambulatory Visit (INDEPENDENT_AMBULATORY_CARE_PROVIDER_SITE_OTHER): Payer: Medicaid Other | Admitting: Obstetrics and Gynecology

## 2019-05-09 ENCOUNTER — Encounter: Payer: Self-pay | Admitting: Obstetrics and Gynecology

## 2019-05-09 ENCOUNTER — Other Ambulatory Visit: Payer: Self-pay

## 2019-05-09 DIAGNOSIS — O2341 Unspecified infection of urinary tract in pregnancy, first trimester: Secondary | ICD-10-CM

## 2019-05-09 DIAGNOSIS — Z22330 Carrier of Group B streptococcus: Secondary | ICD-10-CM

## 2019-05-09 DIAGNOSIS — A749 Chlamydial infection, unspecified: Secondary | ICD-10-CM

## 2019-05-09 DIAGNOSIS — O0991 Supervision of high risk pregnancy, unspecified, first trimester: Secondary | ICD-10-CM

## 2019-05-09 DIAGNOSIS — N390 Urinary tract infection, site not specified: Secondary | ICD-10-CM

## 2019-05-09 DIAGNOSIS — A4902 Methicillin resistant Staphylococcus aureus infection, unspecified site: Secondary | ICD-10-CM

## 2019-05-09 DIAGNOSIS — J45909 Unspecified asthma, uncomplicated: Secondary | ICD-10-CM

## 2019-05-09 DIAGNOSIS — E10319 Type 1 diabetes mellitus with unspecified diabetic retinopathy without macular edema: Secondary | ICD-10-CM

## 2019-05-09 MED ORDER — ALBUTEROL SULFATE HFA 108 (90 BASE) MCG/ACT IN AERS: 2.0000 | INHALATION_SPRAY | Freq: Four times a day (QID) | RESPIRATORY_TRACT | 2 refills | Status: AC | PRN

## 2019-05-09 MED ORDER — SULFAMETHOXAZOLE-TRIMETHOPRIM 800-160 MG PO TABS: 1.0000 | ORAL_TABLET | Freq: Two times a day (BID) | ORAL | 0 refills | Status: DC
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
## 2019-05-09 NOTE — Addendum Note (Signed)
Addended by: Dorian Pod on: 05/09/2019 04:12 PM   Modules accepted: Orders

## 2019-05-09 NOTE — Addendum Note (Signed)
Addended by: Dorian Pod on: 05/09/2019 04:05 PM   Modules accepted: Orders

## 2019-05-10 ENCOUNTER — Telehealth: Payer: Self-pay | Admitting: Obstetrics and Gynecology

## 2019-05-10 NOTE — Telephone Encounter (Signed)
Patient called to follow up with the antibiotic she was prescribed- she wanted to double check that it will treat everything she was diagnosed with. Could you please advise this patient please.  Thank you, TC

## 2019-05-11 NOTE — Telephone Encounter (Signed)
LM to return call.

## 2019-05-15 LAB — MONITOR DRUG PROFILE 14(MW)
Amphetamine Scrn, Ur: NEGATIVE ng/mL
BARBITURATE SCREEN URINE: NEGATIVE ng/mL
BENZODIAZEPINE SCREEN, URINE: NEGATIVE ng/mL
Buprenorphine, Urine: NEGATIVE ng/mL
Cocaine (Metab) Scrn, Ur: NEGATIVE ng/mL
Creatinine(Crt), U: 233.1 mg/dL (ref 20.0–300.0)
Fentanyl, Urine: NEGATIVE pg/mL
Meperidine Screen, Urine: NEGATIVE ng/mL
Methadone Screen, Urine: NEGATIVE ng/mL
OXYCODONE+OXYMORPHONE UR QL SCN: NEGATIVE ng/mL
Opiate Scrn, Ur: NEGATIVE ng/mL
Ph of Urine: 5.5 (ref 4.5–8.9)
Phencyclidine Qn, Ur: NEGATIVE ng/mL
Propoxyphene Scrn, Ur: NEGATIVE ng/mL
SPECIFIC GRAVITY: 1.035
Tramadol Screen, Urine: NEGATIVE ng/mL

## 2019-05-15 LAB — CANNABINOID (GC/MS), URINE
Cannabinoid: POSITIVE — AB
Carboxy THC (GC/MS): >300 ng/mL

## 2019-05-16 ENCOUNTER — Telehealth: Payer: Self-pay | Admitting: Obstetrics and Gynecology

## 2019-05-16 NOTE — Telephone Encounter (Signed)
Patient called saying she thinks the antibiotics she is taking are giving her a yeast infection. Could you please advise this patient.   -TC

## 2019-05-16 NOTE — Telephone Encounter (Signed)
LM for patient to return call.

## 2019-05-17 ENCOUNTER — Telehealth: Payer: Self-pay | Admitting: Obstetrics and Gynecology

## 2019-05-17 NOTE — Telephone Encounter (Signed)
Spoke with patient and she stated that she is not having the vaginal itching today. She thinks that it could be using a condom that is causing the problem. They have been using condom until the 7 days of treatment is complete. I did tell her that it is better not to have intercourse at all until after the treatment of the chlamydia. Patient verbalized understanding.

## 2019-05-17 NOTE — Telephone Encounter (Signed)
Error

## 2019-05-18 ENCOUNTER — Encounter: Payer: Medicaid Other | Attending: Obstetrics and Gynecology | Admitting: *Deleted

## 2019-05-18 ENCOUNTER — Encounter: Payer: Self-pay | Admitting: *Deleted

## 2019-05-18 ENCOUNTER — Other Ambulatory Visit: Payer: Self-pay

## 2019-05-18 VITALS — BP 104/70 | Ht 67.0 in | Wt 287.7 lb

## 2019-05-18 DIAGNOSIS — E10319 Type 1 diabetes mellitus with unspecified diabetic retinopathy without macular edema: Secondary | ICD-10-CM | POA: Insufficient documentation

## 2019-05-18 DIAGNOSIS — Z713 Dietary counseling and surveillance: Secondary | ICD-10-CM | POA: Insufficient documentation

## 2019-05-18 DIAGNOSIS — O24119 Pre-existing diabetes mellitus, type 2, in pregnancy, unspecified trimester: Secondary | ICD-10-CM

## 2019-05-18 NOTE — Patient Instructions (Signed)
Read booklet on Diabetes Management for Mothers-To-Be Follow Gestational Meal Planning Guidelines Don't skip meals Limit fried foods and foods high in fat Limit diet sodas and desserts/sweets Avoid sugar sweetened drinks unless treating a low blood sugar Always carry fast acting glucose and a snack Complete a 3 Day Food Record and bring to next appointment Check blood sugars 2-4 x day - before breakfast and 2 hrs after every meal and record  Bring blood sugar log to all appointments Purchase urine ketone strips if instructed by MD and check urine ketones every am:  If + increase bedtime snack to 1 protein and 2 carbohydrate servings Walk 20-30 minutes at least 5 x week if permitted by MD

## 2019-05-18 NOTE — Progress Notes (Signed)
Diabetes Self-Management Education  Visit Type: First/Initial  Appt. Start Time: 1455 Appt. End Time: 1600  05/18/2019  [name], identified by name and date of birth, is a 34 y.o. female with a diagnosis of Diabetes: Type 2(Pregnant).   ASSESSMENT  Blood pressure 104/70, height _0  (1.702 m), weight 287 lb 11.2 oz (130.5 kg), last menstrual period 03/14/2019, estimated date of delivery 12/25/2019 Body mass index is 45.06 kg/m.  Diabetes Self-Management Education - 05/18/19 1639      Visit Information   Visit Type  First/Initial      Initial Visit   Diabetes Type  Type 2   Pregnant   Are you currently following a meal plan?  No    Are you taking your medications as prescribed?  No   Reports only taking Lantus every 2 -3 days/week due to cost. She is taking Insulin sent to her father. Instructed her to use her Medicaid to pay for insulin and supplies. Also not taking MVI due to nausea. Instructed her to take at lunch with food.   Date Diagnosed  Type 2 - 14 years      Health Coping   How would you rate your overall health?  Poor      Psychosocial Assessment   Patient Belief/Attitude about Diabetes  Defeat/Burnout   "I can't eat 3 meals/day and take medication on time and do everything you are suggesting it is too hard"   Self-care barriers  Lack of material resources    Self-management support  Doctor's office;Friends;Family    Other persons present  Spouse/SO    Patient Concerns  Nutrition/Meal planning;Glycemic Control;Monitoring;Weight Control;Healthy Lifestyle    Special Needs  None    Preferred Learning Style  Auditory;Hands on    Learning Readiness  Contemplating    How often do you need to have someone help you when you read instructions, pamphlets, or other written materials from your doctor or pharmacy?  1 - Never    What is the last grade level you completed in school?  12th      Pre-Education Assessment   Patient understands the diabetes disease and  treatment process.  Needs Instruction    Patient understands incorporating nutritional management into lifestyle.  Needs Instruction    Patient undertands incorporating physical activity into lifestyle.  Needs Instruction    Patient understands using medications safely.  Needs Instruction    Patient understands monitoring blood glucose, interpreting and using results  Needs Review    Patient understands prevention, detection, and treatment of acute complications.  Needs Instruction    Patient understands prevention, detection, and treatment of chronic complications.  Needs Instruction    Patient understands how to develop strategies to address psychosocial issues.  Needs Instruction    Patient understands how to develop strategies to promote health/change behavior.  Needs Instruction      Complications   How often do you check your blood sugar?  3-4 times / week   only checking 2-3 x week; instructed her to get supplies covered by Medicaid; also provided [employer] card to get discount on strips   Fasting Blood glucose range (mg/dL)  130-179;180-200;>200   She reports FBG's 169, 194, 207 mg/dL   Have you had a dilated eye exam in the past 12 months?  Yes    Have you had a dental exam in the past 12 months?  No    Are you checking your feet?  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) - [employer] and cheese biscuit with extra bacon and cheese, burrito, [employer]  skips    [employer], beef, chicken, corn, potatoes, mac-n-cheese, pasta, peas, green beans, broccoli, cuccumbers    Beverage(s)  very little water, diet sodas      Exercise   Exercise Type  ADL's      Patient Education   Previous Diabetes Education  Yes (please comment)   pt reports education by phone during one of her pregnancies   Disease state   Definition of diabetes, type 1 and 2, and the diagnosis of diabetes;Explored patient's options for treatment of their diabetes   discussed use  of different insulins (N and R) that can be purchased from pharmacy that is lower cost - usually vial/syringe is what Medicaid covers and not pens   Nutrition management   Role of diet in the treatment of diabetes and the relationship between the three main macronutrients and blood glucose level;Food label reading, portion sizes and measuring food.;Reviewed blood glucose goals for pre and post meals and how to evaluate the patients' food intake on their blood glucose level.;Information on hints to eating out and maintain blood glucose control.    Physical activity and exercise   Role of exercise on diabetes management, blood pressure control and cardiac health.    Medications  Taught/reviewed insulin injection, site rotation, insulin storage and needle disposal.;Reviewed patients medication for diabetes, action, purpose, timing of dose and side effects. Using the same pen needle for 2 weeks   Monitoring  Purpose and frequency of SMBG.;Taught/discussed recording of test results and interpretation of SMBG.;Ketone testing, when, how.    Acute complications  Taught treatment of hypoglycemia - the 15 rule.    Chronic complications  Relationship between chronic complications and blood glucose control;Retinopathy and reason for yearly dilated eye exams   Pt had eye exam and was noted to have retinopathy. She was to return after being on insulin for 3 months but didn't go back because she is not consistently taking her insulin.   Psychosocial adjustment  Identified and addressed patients feelings and concerns about diabetes    Preconception care  Reviewed with patient blood glucose goals with pregnancy;Pregnancy and GDM  Role of pre-pregnancy blood glucose control on the development of the fetus      Individualized Goals (developed by patient)   Reducing Risk  Other (comment)   improve blood sugars, prevent diabetes complications, lose weight, lead a healthier lifestyle     Outcomes   Expected Outcomes   Demonstrated limited interest in learning.  Expect minimal changes   Pt reports she didn't know she was coming for diabetes education because she has been through this before.      Individualized Plan for Diabetes Self-Management Training:   Learning Objective:  Patient will have a greater understanding of diabetes self-management. Patient education plan is to attend individual and/or group sessions per assessed needs and concerns.   Plan:   Patient Instructions  Read booklet on Diabetes Management for Mothers-To-Be Follow Gestational Meal Planning Guidelines Don't skip meals Limit fried foods and foods high in fat Limit diet sodas and desserts/sweets Avoid sugar sweetened drinks unless treating a low blood sugar Always carry fast acting glucose and a snack Complete a 3 Day Food Record and bring to next appointment Check blood sugars 2-4 x day - before breakfast and 2 hrs after every meal and record  Bring blood sugar  log to all appointments Purchase urine ketone strips if instructed by MD and check urine ketones every am:  If + increase bedtime snack to 1 protein and 2 carbohydrate servings Walk 20-30 minutes at least 5 x week if permitted by MD  Expected Outcomes:  Demonstrated limited interest in learning.  Expect minimal changes(Pt reports she didn't know she was coming for diabetes education because she has been through this before.)  Education material provided:  Gestational Booklet Gestational Meal Planning Guidelines Simple Meal Plan 3 Day Food Record Goals for a Healthy Pregnancy Glucose tablets Symptoms, causes and treatments of Hypoglycemia 4 packs (20 total) pen needles (BD) 4 mm 32 g  If problems or questions, patient to contact team via:  Johny Drilling, [place], [place], CDE [phone]  Future DSME appointment:  May 25, 2019 with the dietitian

## 2019-05-24 ENCOUNTER — Other Ambulatory Visit: Payer: Medicaid Other

## 2019-05-25 ENCOUNTER — Encounter: Payer: Medicaid Other | Attending: Obstetrics and Gynecology | Admitting: Dietician

## 2019-05-25 ENCOUNTER — Encounter: Payer: Self-pay | Admitting: Dietician

## 2019-05-25 ENCOUNTER — Other Ambulatory Visit: Payer: Self-pay

## 2019-05-25 ENCOUNTER — Ambulatory Visit (INDEPENDENT_AMBULATORY_CARE_PROVIDER_SITE_OTHER): Payer: Medicaid Other

## 2019-05-25 VITALS — BP 140/82 | Ht 67.0 in | Wt 289.9 lb

## 2019-05-25 DIAGNOSIS — O26841 Uterine size-date discrepancy, first trimester: Secondary | ICD-10-CM

## 2019-05-25 DIAGNOSIS — O0991 Supervision of high risk pregnancy, unspecified, first trimester: Secondary | ICD-10-CM

## 2019-05-25 DIAGNOSIS — O24119 Pre-existing diabetes mellitus, type 2, in pregnancy, unspecified trimester: Secondary | ICD-10-CM

## 2019-05-25 DIAGNOSIS — E10319 Type 1 diabetes mellitus with unspecified diabetic retinopathy without macular edema: Secondary | ICD-10-CM | POA: Insufficient documentation

## 2019-05-25 DIAGNOSIS — Z3A01 Less than 8 weeks gestation of pregnancy: Secondary | ICD-10-CM

## 2019-05-25 DIAGNOSIS — Z3689 Encounter for other specified antenatal screening: Secondary | ICD-10-CM | POA: Diagnosis not present

## 2019-05-25 DIAGNOSIS — Z713 Dietary counseling and surveillance: Secondary | ICD-10-CM | POA: Insufficient documentation

## 2019-05-25 NOTE — Progress Notes (Signed)
.   Patient has not been testing BGs as she does not have any unexpired supplies. Advised her to obtain prescription.  . Patient was under the impression she was coming to this office to obtain testing supplies and meds. She states she does not need diet or diabetes management education as she has been managing her diabetes for several years.   . Provided basic meal plan, and brief instruction on goal for carb intake and importance of protein sources. Also discussed importance of eating at regular intervals. . Instructed patient on food safety, including avoidance of Listeriosis, and limiting mercury from fish. . Patient voiced concern over baby's health, as she had ultrasound today and was given gestational age of [redacted] weeks, when she should be at 9 weeks. Advised her to contact MD office to clarify.

## 2019-06-01 ENCOUNTER — Ambulatory Visit (INDEPENDENT_AMBULATORY_CARE_PROVIDER_SITE_OTHER): Payer: Medicaid Other

## 2019-06-01 ENCOUNTER — Other Ambulatory Visit: Payer: Self-pay | Admitting: Surgical

## 2019-06-01 ENCOUNTER — Other Ambulatory Visit: Payer: Self-pay

## 2019-06-01 DIAGNOSIS — Z3A01 Less than 8 weeks gestation of pregnancy: Secondary | ICD-10-CM | POA: Diagnosis not present

## 2019-06-01 DIAGNOSIS — Z3689 Encounter for other specified antenatal screening: Secondary | ICD-10-CM

## 2019-06-01 DIAGNOSIS — O26841 Uterine size-date discrepancy, first trimester: Secondary | ICD-10-CM | POA: Diagnosis not present

## 2019-06-06 ENCOUNTER — Other Ambulatory Visit: Payer: Self-pay

## 2019-06-06 ENCOUNTER — Encounter: Payer: Self-pay | Admitting: Emergency Medicine

## 2019-06-06 ENCOUNTER — Telehealth: Payer: Self-pay | Admitting: Obstetrics and Gynecology

## 2019-06-06 ENCOUNTER — Emergency Department
Admission: EM | Admit: 2019-06-06 | Discharge: 2019-06-06 | Disposition: A | Discharge status: Home / Self Care | Payer: Medicaid Other | Attending: Emergency Medicine | Admitting: Emergency Medicine

## 2019-06-06 ENCOUNTER — Emergency Department: Payer: Medicaid Other

## 2019-06-06 DIAGNOSIS — O99511 Diseases of the respiratory system complicating pregnancy, first trimester: Secondary | ICD-10-CM | POA: Diagnosis not present

## 2019-06-06 DIAGNOSIS — J45909 Unspecified asthma, uncomplicated: Secondary | ICD-10-CM | POA: Insufficient documentation

## 2019-06-06 DIAGNOSIS — R102 Pelvic and perineal pain: Secondary | ICD-10-CM | POA: Diagnosis not present

## 2019-06-06 DIAGNOSIS — Z794 Long term (current) use of insulin: Secondary | ICD-10-CM | POA: Diagnosis not present

## 2019-06-06 DIAGNOSIS — O24911 Unspecified diabetes mellitus in pregnancy, first trimester: Secondary | ICD-10-CM | POA: Insufficient documentation

## 2019-06-06 DIAGNOSIS — O26891 Other specified pregnancy related conditions, first trimester: Secondary | ICD-10-CM | POA: Diagnosis not present

## 2019-06-06 DIAGNOSIS — Z3A01 Less than 8 weeks gestation of pregnancy: Secondary | ICD-10-CM | POA: Insufficient documentation

## 2019-06-06 DIAGNOSIS — O209 Hemorrhage in early pregnancy, unspecified: Secondary | ICD-10-CM | POA: Diagnosis not present

## 2019-06-06 DIAGNOSIS — Z79899 Other long term (current) drug therapy: Secondary | ICD-10-CM | POA: Insufficient documentation

## 2019-06-06 LAB — ABO/RH: ABO/RH(D): A NEG

## 2019-06-06 LAB — ANTIBODY SCREEN: Antibody Screen: NEGATIVE

## 2019-06-06 LAB — POCT PREGNANCY, URINE: Preg Test, Ur: POSITIVE — AB

## 2019-06-06 LAB — HCG, QUANTITATIVE, PREGNANCY: hCG, Beta Chain, Quant, S: 3229 m[IU]/mL — ABNORMAL HIGH (ref <5)

## 2019-06-06 MED ORDER — OXYCODONE-ACETAMINOPHEN 5-325 MG PO TABS
1.0000 | ORAL_TABLET | Freq: Once | ORAL | Status: AC
  Administered 2019-06-06: 1 via ORAL
  Filled 2019-06-06: qty 1

## 2019-06-06 MED ORDER — NAPROXEN 500 MG PO TABS: 500.0000 mg | ORAL_TABLET | Freq: Two times a day (BID) | ORAL | 0 refills | Status: DC

## 2019-06-06 MED ORDER — NAPROXEN 500 MG PO TABS
500.0000 mg | ORAL_TABLET | Freq: Once | ORAL | Status: AC
  Administered 2019-06-06: 500 mg via ORAL
  Filled 2019-06-06: qty 1

## 2019-06-06 MED ORDER — OXYCODONE-ACETAMINOPHEN 5-325 MG PO TABS: 1.0000 | ORAL_TABLET | Freq: Four times a day (QID) | ORAL | 0 refills | Status: AC | PRN

## 2019-06-06 MED ORDER — RHO D IMMUNE GLOBULIN 1500 UNIT/2ML IJ SOSY
300.0000 ug | PREFILLED_SYRINGE | Freq: Once | INTRAMUSCULAR | Status: DC
  Filled 2019-06-06: qty 2

## 2019-06-06 MED ORDER — RHO D IMMUNE GLOBULIN 1500 UNIT/2ML IJ SOSY
300.0000 ug | PREFILLED_SYRINGE | Freq: Once | INTRAMUSCULAR | Status: AC
  Administered 2019-06-06: 300 ug via INTRAMUSCULAR
  Filled 2019-06-06: qty 2

## 2019-06-06 NOTE — Telephone Encounter (Signed)
Pt called in and stated that she went to the ED. They told her that she is having a miscarried. The pt was calling to cancel her appt for tomorrow. The pt was wanting to know if she needs to be seen. They told her she would pass the miscarriage. The pt is requesting a call back. Please advise
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
## 2019-06-06 NOTE — ED Triage Notes (Signed)
Pt in via POV, reports being approximately 6-[redacted] weeks pregnant, presents today with intermittent vaginal bleeding and pelvic pain since yesterday.  Ambulatory to triage, NAD noted at this time.

## 2019-06-06 NOTE — ED Notes (Signed)
Pt alert and oriented X 4, stable for discharge. RR even and unlabored, color WNL. Discussed discharge instructions and follow up when appropriate. Instructed to follow up with ER for any life threatening symptoms or concerns that patient or family of patient may have  

## 2019-06-06 NOTE — ED Notes (Signed)
ED Provider at bedside. 

## 2019-06-06 NOTE — ED Notes (Signed)
US at bedside

## 2019-06-06 NOTE — Telephone Encounter (Signed)
Scheduled patient to come in tomorrow 

## 2019-06-07 ENCOUNTER — Encounter: Payer: Self-pay | Admitting: Surgical

## 2019-06-07 ENCOUNTER — Other Ambulatory Visit: Payer: Medicaid Other

## 2019-06-07 ENCOUNTER — Encounter: Payer: Self-pay | Admitting: Obstetrics and Gynecology

## 2019-06-07 ENCOUNTER — Ambulatory Visit (INDEPENDENT_AMBULATORY_CARE_PROVIDER_SITE_OTHER): Payer: Medicaid Other | Admitting: Obstetrics and Gynecology

## 2019-06-07 VITALS — BP 126/83 | HR 67 | Ht 67.0 in | Wt 290.3 lb

## 2019-06-07 DIAGNOSIS — O021 Missed abortion: Secondary | ICD-10-CM | POA: Diagnosis not present

## 2019-06-07 DIAGNOSIS — Z01818 Encounter for other preprocedural examination: Secondary | ICD-10-CM

## 2019-06-07 LAB — RHOGAM INJECTION: Unit division: 0
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
## 2019-06-08 ENCOUNTER — Telehealth: Payer: Self-pay | Admitting: Obstetrics and Gynecology

## 2019-06-08 NOTE — Telephone Encounter (Signed)
Spoke with patient and she is starting to bleed a more. She stated that she is not passing clots. If anything they are very small.

## 2019-06-08 NOTE — Telephone Encounter (Signed)
Per Dr. Logan Bores will keep D&E scheduled for Monday. Patient aware.

## 2019-06-08 NOTE — Telephone Encounter (Signed)
Pt called in and stated that she is having some black and dark red, discharge. The pt is requesting a call from the nurse. She has some questions. Please advise

## 2019-06-09 ENCOUNTER — Other Ambulatory Visit: Payer: Self-pay

## 2019-06-09 ENCOUNTER — Other Ambulatory Visit
Admission: RE | Admit: 2019-06-09 | Discharge: 2019-06-09 | Disposition: A | Discharge status: Home / Self Care | Payer: Medicaid Other | Source: Ambulatory Visit | Attending: Obstetrics and Gynecology | Admitting: Obstetrics and Gynecology

## 2019-06-09 DIAGNOSIS — Z01812 Encounter for preprocedural laboratory examination: Secondary | ICD-10-CM | POA: Diagnosis not present

## 2019-06-09 DIAGNOSIS — Z20822 Contact with and (suspected) exposure to covid-19: Secondary | ICD-10-CM | POA: Insufficient documentation

## 2019-06-09 LAB — SARS CORONAVIRUS 2 (TAT 6-24 HRS): SARS Coronavirus 2: NEGATIVE

## 2019-06-10 ENCOUNTER — Other Ambulatory Visit: Payer: Self-pay

## 2019-06-10 ENCOUNTER — Emergency Department
Admission: EM | Admit: 2019-06-10 | Discharge: 2019-06-10 | Disposition: A | Discharge status: Home / Self Care | Payer: Medicaid Other | Attending: Emergency Medicine | Admitting: Emergency Medicine

## 2019-06-10 DIAGNOSIS — J45909 Unspecified asthma, uncomplicated: Secondary | ICD-10-CM | POA: Insufficient documentation

## 2019-06-10 DIAGNOSIS — E119 Type 2 diabetes mellitus without complications: Secondary | ICD-10-CM | POA: Diagnosis not present

## 2019-06-10 DIAGNOSIS — O209 Hemorrhage in early pregnancy, unspecified: Secondary | ICD-10-CM | POA: Diagnosis present

## 2019-06-10 DIAGNOSIS — R109 Unspecified abdominal pain: Secondary | ICD-10-CM | POA: Diagnosis not present

## 2019-06-10 DIAGNOSIS — O039 Complete or unspecified spontaneous abortion without complication: Secondary | ICD-10-CM | POA: Insufficient documentation

## 2019-06-10 DIAGNOSIS — Z7722 Contact with and (suspected) exposure to environmental tobacco smoke (acute) (chronic): Secondary | ICD-10-CM | POA: Insufficient documentation

## 2019-06-10 DIAGNOSIS — O99320 Drug use complicating pregnancy, unspecified trimester: Secondary | ICD-10-CM | POA: Diagnosis not present

## 2019-06-10 DIAGNOSIS — F1210 Cannabis abuse, uncomplicated: Secondary | ICD-10-CM | POA: Insufficient documentation

## 2019-06-10 DIAGNOSIS — Z3A00 Weeks of gestation of pregnancy not specified: Secondary | ICD-10-CM | POA: Diagnosis not present

## 2019-06-10 LAB — TYPE AND SCREEN
ABO/RH(D): A NEG
Antibody Screen: POSITIVE

## 2019-06-10 LAB — BASIC METABOLIC PANEL
Anion gap: 12 (ref 5–15)
BUN: 9 mg/dL (ref 6–20)
CO2: 23 mmol/L (ref 22–32)
Calcium: 9 mg/dL (ref 8.9–10.3)
Chloride: 98 mmol/L (ref 98–111)
Creatinine, Ser: 0.73 mg/dL (ref 0.44–1.00)
GFR calc Af Amer: >60 mL/min (ref >60)
GFR calc non Af Amer: >60 mL/min (ref >60)
Glucose, Bld: 252 mg/dL — ABNORMAL HIGH (ref 70–99)
Potassium: 4 mmol/L (ref 3.5–5.1)
Sodium: 133 mmol/L — ABNORMAL LOW (ref 135–145)

## 2019-06-10 LAB — CBC
HCT: 39.7 % (ref 36.0–46.0)
Hemoglobin: 12.5 g/dL (ref 12.0–15.0)
MCH: 26.2 pg (ref 26.0–34.0)
MCHC: 31.5 g/dL (ref 30.0–36.0)
MCV: 83.1 fL (ref 80.0–100.0)
Platelets: 477 10*3/uL — ABNORMAL HIGH (ref 150–400)
RBC: 4.78 MIL/uL (ref 3.87–5.11)
RDW: 15 % (ref 11.5–15.5)
WBC: 16.8 10*3/uL — ABNORMAL HIGH (ref 4.0–10.5)
nRBC: 0 % (ref 0.0–0.2)

## 2019-06-10 LAB — HCG, QUANTITATIVE, PREGNANCY: hCG, Beta Chain, Quant, S: 995 m[IU]/mL — ABNORMAL HIGH (ref <5)

## 2019-06-10 MED ORDER — MORPHINE SULFATE (PF) 4 MG/ML IV SOLN
4.0000 mg | Freq: Once | INTRAVENOUS | Status: AC
  Administered 2019-06-10: 4 mg via INTRAVENOUS
  Filled 2019-06-10: qty 1

## 2019-06-10 MED ORDER — OXYCODONE-ACETAMINOPHEN 5-325 MG PO TABS: 1.0000 | ORAL_TABLET | Freq: Three times a day (TID) | ORAL | 0 refills | Status: DC | PRN

## 2019-06-10 MED ORDER — SODIUM CHLORIDE 0.9 % IV SOLN: Freq: Once | INTRAVENOUS | Status: AC

## 2019-06-10 MED ORDER — ONDANSETRON HCL 4 MG/2ML IJ SOLN
4.0000 mg | Freq: Once | INTRAMUSCULAR | Status: AC
  Administered 2019-06-10: 4 mg via INTRAVENOUS
  Filled 2019-06-10: qty 2

## 2019-06-10 MED ORDER — OXYCODONE-ACETAMINOPHEN 5-325 MG PO TABS
2.0000 | ORAL_TABLET | Freq: Once | ORAL | Status: AC
  Administered 2019-06-10: 2 via ORAL
  Filled 2019-06-10: qty 2

## 2019-06-10 MED ORDER — LORAZEPAM 1 MG PO TABS: 1.0000 mg | ORAL_TABLET | Freq: Two times a day (BID) | ORAL | 0 refills | Status: AC

## 2019-06-10 MED ORDER — LORAZEPAM 2 MG/ML IJ SOLN
1.0000 mg | Freq: Once | INTRAMUSCULAR | Status: AC
  Administered 2019-06-10: 1 mg via INTRAVENOUS
  Filled 2019-06-10: qty 1

## 2019-06-10 NOTE — ED Notes (Signed)
MD at bedside. 
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
## 2019-06-10 NOTE — ED Notes (Signed)
Pt verbalized understanding of discharge instructions. NAD at this time. 

## 2019-06-10 NOTE — ED Triage Notes (Signed)
Pt arrived via POV with reports of abdominal pain and heavy vaginal bleeding due to miscarriage.  Pt reports constant bleeding and passing large clots.    Pt had D & C scheduled for Monday.

## 2019-06-10 NOTE — ED Notes (Signed)
Pt with c/o of heavy bleeding with clots and abdominal pain. Pt is anxious and stating that she "can't breath". Pt is A&O x4. Pt is ambulating to toilet in room without difficulty.

## 2019-06-12 ENCOUNTER — Encounter: Payer: Self-pay | Admitting: Obstetrics and Gynecology

## 2019-06-12 ENCOUNTER — Encounter: Payer: Self-pay | Admitting: Anesthesiology

## 2019-06-12 ENCOUNTER — Encounter
Admission: RE | Disposition: A | Discharge status: Home / Self Care | Payer: Self-pay | Source: Home / Self Care | Attending: Obstetrics and Gynecology

## 2019-06-12 ENCOUNTER — Ambulatory Visit
Admission: RE | Admit: 2019-06-12 | Discharge: 2019-06-12 | Disposition: A | Discharge status: Home / Self Care | Payer: Medicaid Other | Attending: Obstetrics and Gynecology | Admitting: Obstetrics and Gynecology

## 2019-06-12 ENCOUNTER — Other Ambulatory Visit: Payer: Self-pay

## 2019-06-12 DIAGNOSIS — O039 Complete or unspecified spontaneous abortion without complication: Secondary | ICD-10-CM | POA: Insufficient documentation

## 2019-06-12 LAB — CBC
HCT: 35 % — ABNORMAL LOW (ref 36.0–46.0)
Hemoglobin: 11 g/dL — ABNORMAL LOW (ref 12.0–15.0)
MCH: 26.3 pg (ref 26.0–34.0)
MCHC: 31.4 g/dL (ref 30.0–36.0)
MCV: 83.7 fL (ref 80.0–100.0)
Platelets: 389 10*3/uL (ref 150–400)
RBC: 4.18 MIL/uL (ref 3.87–5.11)
RDW: 15.3 % (ref 11.5–15.5)
WBC: 9.5 10*3/uL (ref 4.0–10.5)
nRBC: 0 % (ref 0.0–0.2)

## 2019-06-12 LAB — TYPE AND SCREEN
ABO/RH(D): A NEG
Antibody Screen: POSITIVE

## 2019-06-12 LAB — GLUCOSE, CAPILLARY: Glucose-Capillary: 183 mg/dL — ABNORMAL HIGH (ref 70–99)

## 2019-06-12 LAB — URINE DRUG SCREEN, QUALITATIVE (ARMC ONLY)
Amphetamines, Ur Screen: NOT DETECTED
Barbiturates, Ur Screen: NOT DETECTED
Benzodiazepine, Ur Scrn: NOT DETECTED
Cannabinoid 50 Ng, Ur ~~LOC~~: POSITIVE — AB
Cocaine Metabolite,Ur ~~LOC~~: NOT DETECTED
MDMA (Ecstasy)Ur Screen: NOT DETECTED
Methadone Scn, Ur: NOT DETECTED
Opiate, Ur Screen: NOT DETECTED
Phencyclidine (PCP) Ur S: NOT DETECTED
Tricyclic, Ur Screen: NOT DETECTED

## 2019-06-12 LAB — HCG, QUANTITATIVE, PREGNANCY: hCG, Beta Chain, Quant, S: 146 m[IU]/mL — ABNORMAL HIGH (ref <5)

## 2019-06-12 SURGERY — DILATION AND EVACUATION, UTERUS
Anesthesia: General

## 2019-06-12 MED ORDER — LACTATED RINGERS IV SOLN: INTRAVENOUS | Status: DC

## 2019-06-12 MED ORDER — FAMOTIDINE 20 MG PO TABS: 20.0000 mg | ORAL_TABLET | Freq: Once | ORAL | Status: DC

## 2019-06-12 SURGICAL SUPPLY — 28 items
ADAPTER VACURETTE TBG SET 14 (CANNULA) IMPLANT
CATH ROBINSON RED A/P 16FR (CATHETERS) ×4 IMPLANT
COVER WAND RF STERILE (DRAPES) IMPLANT
CUP MEDICINE 2OZ PLAST GRAD ST (MISCELLANEOUS) ×4 IMPLANT
DRSG TELFA 3X8 NADH (GAUZE/BANDAGES/DRESSINGS) ×3 IMPLANT
FILTER UTR ASPR SPEC (MISCELLANEOUS) ×2 IMPLANT
FLTR UTR ASPR SPEC (MISCELLANEOUS) ×3
GAUZE 4X4 16PLY RFD (DISPOSABLE) ×4 IMPLANT
GLOVE BIOGEL PI ORTHO PRO 7.5 (GLOVE) ×2
GLOVE PI ORTHO PRO STRL 7.5 (GLOVE) ×2 IMPLANT
GOWN STRL REUS W/ TWL LRG LVL3 (GOWN DISPOSABLE) ×4 IMPLANT
GOWN STRL REUS W/TWL LRG LVL3 (GOWN DISPOSABLE) ×6
KIT BERKELEY 1ST TRIMESTER 3/8 (MISCELLANEOUS) ×4 IMPLANT
KIT TURNOVER KIT A (KITS) ×4 IMPLANT
NDL HYPO 25X1 1.5 SAFETY (NEEDLE) ×1 IMPLANT
NEEDLE HYPO 25X1 1.5 SAFETY (NEEDLE) ×3 IMPLANT
PACK DNC HYST (MISCELLANEOUS) ×4 IMPLANT
PAD DRESSING TELFA 3X8 NADH (GAUZE/BANDAGES/DRESSINGS) ×1 IMPLANT
PAD OB MATERNITY 4.3X12.25 (PERSONAL CARE ITEMS) ×4 IMPLANT
PAD PREP 24X41 OB/GYN DISP (PERSONAL CARE ITEMS) ×4 IMPLANT
SET BERKELEY SUCTION TUBING (SUCTIONS) ×4 IMPLANT
SOL PREP PVP 2OZ (MISCELLANEOUS) ×3
SOLUTION PREP PVP 2OZ (MISCELLANEOUS) ×2 IMPLANT
VACURETTE 10 RIGID CVD (CANNULA) IMPLANT
VACURETTE 12 RIGID CVD (CANNULA) IMPLANT
VACURETTE 7MM F TIP (CANNULA)
VACURETTE 7MM F TIP STRL (CANNULA) IMPLANT
VACURETTE 8 RIGID CVD (CANNULA) IMPLANT

## 2019-06-12 NOTE — Progress Notes (Signed)
Pt arrived for surgery. Pt states on Saturday 06/10/19 that she started bleeding heavily and noticed clots and white material. Pt was seen in ED. Pt having very light bleeding now. Dr. Logan Bores at bedside. Spoke with pt and decided pt had a spontaneous abortion and that she no longer needs surgery. Pt sent home.

## 2019-06-20 ENCOUNTER — Encounter: Payer: Medicaid Other | Admitting: Obstetrics and Gynecology

## 2019-06-21 ENCOUNTER — Encounter: Payer: Medicaid Other | Admitting: Obstetrics and Gynecology

## 2020-07-06 ENCOUNTER — Other Ambulatory Visit: Payer: Self-pay

## 2020-07-06 ENCOUNTER — Emergency Department
Admission: EM | Admit: 2020-07-06 | Discharge: 2020-07-06 | Disposition: A | Discharge status: Home / Self Care | Payer: Medicaid Other | Attending: Emergency Medicine | Admitting: Emergency Medicine

## 2020-07-06 DIAGNOSIS — J45909 Unspecified asthma, uncomplicated: Secondary | ICD-10-CM | POA: Diagnosis not present

## 2020-07-06 DIAGNOSIS — Z7722 Contact with and (suspected) exposure to environmental tobacco smoke (acute) (chronic): Secondary | ICD-10-CM | POA: Diagnosis not present

## 2020-07-06 DIAGNOSIS — O24912 Unspecified diabetes mellitus in pregnancy, second trimester: Secondary | ICD-10-CM | POA: Insufficient documentation

## 2020-07-06 DIAGNOSIS — Z794 Long term (current) use of insulin: Secondary | ICD-10-CM | POA: Diagnosis not present

## 2020-07-06 DIAGNOSIS — O99512 Diseases of the respiratory system complicating pregnancy, second trimester: Secondary | ICD-10-CM | POA: Diagnosis not present

## 2020-07-06 DIAGNOSIS — R1110 Vomiting, unspecified: Secondary | ICD-10-CM

## 2020-07-06 DIAGNOSIS — E119 Type 2 diabetes mellitus without complications: Secondary | ICD-10-CM | POA: Diagnosis not present

## 2020-07-06 DIAGNOSIS — O219 Vomiting of pregnancy, unspecified: Secondary | ICD-10-CM | POA: Diagnosis present

## 2020-07-06 DIAGNOSIS — Z3A18 18 weeks gestation of pregnancy: Secondary | ICD-10-CM | POA: Diagnosis not present

## 2020-07-06 LAB — URINALYSIS, COMPLETE (UACMP) WITH MICROSCOPIC
Bacteria, UA: NONE SEEN
Bilirubin Urine: NEGATIVE
Glucose, UA: >=500 mg/dL — AB
Hgb urine dipstick: NEGATIVE
Ketones, ur: 80 mg/dL — AB
Leukocytes,Ua: NEGATIVE
Nitrite: NEGATIVE
Protein, ur: 30 mg/dL — AB
Specific Gravity, Urine: 1.029 (ref 1.005–1.030)
pH: 5 (ref 5.0–8.0)

## 2020-07-06 MED ORDER — ONDANSETRON 4 MG PO TBDP
4.0000 mg | ORAL_TABLET | Freq: Once | ORAL | Status: AC
  Administered 2020-07-06: 4 mg via ORAL
  Filled 2020-07-06: qty 1
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
## 2020-07-06 NOTE — ED Triage Notes (Signed)
Pt comes pov from work [redacted] weeks pregnant with nausea. Pt states that she tried to eat a cheeseburger and was getting nauseous and vomited x2. Pt's boss is requiring a work note for pt to have left work without getting someone to cover her shift so pt came here.

## 2020-07-06 NOTE — Discharge Instructions (Signed)
Follow-up with your regular doctor if not improving in 2 to 3 days.  Return emergency department worsening. 

## 2020-07-06 NOTE — ED Notes (Signed)
See triage note. This is pt's 3rd baby. C/o N/V which caused her to leave work. Pt resp reg/unlabored. Skin dry. In NAD.

## 2020-07-17 ENCOUNTER — Other Ambulatory Visit: Payer: Self-pay

## 2020-07-17 ENCOUNTER — Emergency Department
Admission: EM | Admit: 2020-07-17 | Discharge: 2020-07-17 | Disposition: A | Discharge status: Home / Self Care | Payer: Medicaid Other | Attending: Emergency Medicine | Admitting: Emergency Medicine

## 2020-07-17 DIAGNOSIS — Z3A22 22 weeks gestation of pregnancy: Secondary | ICD-10-CM | POA: Insufficient documentation

## 2020-07-17 DIAGNOSIS — Z7722 Contact with and (suspected) exposure to environmental tobacco smoke (acute) (chronic): Secondary | ICD-10-CM | POA: Diagnosis not present

## 2020-07-17 DIAGNOSIS — J45909 Unspecified asthma, uncomplicated: Secondary | ICD-10-CM | POA: Diagnosis not present

## 2020-07-17 DIAGNOSIS — E119 Type 2 diabetes mellitus without complications: Secondary | ICD-10-CM | POA: Insufficient documentation

## 2020-07-17 DIAGNOSIS — Z794 Long term (current) use of insulin: Secondary | ICD-10-CM | POA: Insufficient documentation

## 2020-07-17 DIAGNOSIS — R109 Unspecified abdominal pain: Secondary | ICD-10-CM | POA: Diagnosis not present

## 2020-07-17 DIAGNOSIS — O26892 Other specified pregnancy related conditions, second trimester: Secondary | ICD-10-CM | POA: Diagnosis not present

## 2020-07-17 DIAGNOSIS — R059 Cough, unspecified: Secondary | ICD-10-CM | POA: Insufficient documentation

## 2020-07-17 LAB — BASIC METABOLIC PANEL
Anion gap: 7 (ref 5–15)
BUN: 9 mg/dL (ref 6–20)
CO2: 24 mmol/L (ref 22–32)
Calcium: 8.9 mg/dL (ref 8.9–10.3)
Chloride: 105 mmol/L (ref 98–111)
Creatinine, Ser: 0.57 mg/dL (ref 0.44–1.00)
GFR, Estimated: >60 mL/min (ref >60)
Glucose, Bld: 161 mg/dL — ABNORMAL HIGH (ref 70–99)
Potassium: 4.1 mmol/L (ref 3.5–5.1)
Sodium: 136 mmol/L (ref 135–145)

## 2020-07-17 LAB — URINALYSIS, COMPLETE (UACMP) WITH MICROSCOPIC
Bacteria, UA: NONE SEEN
Bilirubin Urine: NEGATIVE
Glucose, UA: >=500 mg/dL — AB
Hgb urine dipstick: NEGATIVE
Ketones, ur: 5 mg/dL — AB
Leukocytes,Ua: NEGATIVE
Nitrite: NEGATIVE
Protein, ur: NEGATIVE mg/dL
Specific Gravity, Urine: 1.032 — ABNORMAL HIGH (ref 1.005–1.030)
pH: 6 (ref 5.0–8.0)

## 2020-07-17 LAB — CBC
HCT: 32.9 % — ABNORMAL LOW (ref 36.0–46.0)
Hemoglobin: 10.5 g/dL — ABNORMAL LOW (ref 12.0–15.0)
MCH: 25.7 pg — ABNORMAL LOW (ref 26.0–34.0)
MCHC: 31.9 g/dL (ref 30.0–36.0)
MCV: 80.4 fL (ref 80.0–100.0)
Platelets: 312 10*3/uL (ref 150–400)
RBC: 4.09 MIL/uL (ref 3.87–5.11)
RDW: 17.2 % — ABNORMAL HIGH (ref 11.5–15.5)
WBC: 12.6 10*3/uL — ABNORMAL HIGH (ref 4.0–10.5)
nRBC: 0 % (ref 0.0–0.2)

## 2020-07-17 LAB — HEPATIC FUNCTION PANEL
ALT: 13 U/L (ref 0–44)
AST: 14 U/L — ABNORMAL LOW (ref 15–41)
Albumin: 3 g/dL — ABNORMAL LOW (ref 3.5–5.0)
Alkaline Phosphatase: 49 U/L (ref 38–126)
Bilirubin, Direct: <0.1 mg/dL (ref 0.0–0.2)
Total Bilirubin: 0.3 mg/dL (ref 0.3–1.2)
Total Protein: 6.9 g/dL (ref 6.5–8.1)

## 2020-07-17 MED ORDER — LIDOCAINE HCL (PF) 1 % IJ SOLN
INTRAMUSCULAR | Status: AC
  Filled 2020-07-17: qty 10

## 2020-07-17 MED ORDER — ACETAMINOPHEN 500 MG PO TABS
1000.0000 mg | ORAL_TABLET | Freq: Once | ORAL | Status: AC
  Administered 2020-07-17: 1000 mg via ORAL
  Filled 2020-07-17: qty 2

## 2020-07-17 MED ORDER — LIDOCAINE HCL (PF) 1 % IJ SOLN
10.0000 mL | Freq: Once | INTRAMUSCULAR | Status: AC
  Administered 2020-07-17: 10 mL
  Filled 2020-07-17: qty 10

## 2020-07-17 NOTE — ED Triage Notes (Signed)
Pt to ER via POV with complaints of L sided flank pain that started over a week ago. Pt reports a bad cough and the pain started after. States the pain got worse today and radiates into her ribs. Reports the pain is sharp in her L ribs. Denies dysuria or urinary frequency. Denies hx of kidney stones.
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
## 2020-07-17 NOTE — Discharge Instructions (Signed)
Use Tylenol for pain and fevers.  Up to 1000 mg per dose, up to 4 times per day.  Do not take more than 4000 mg of Tylenol/acetaminophen within 24 hours..  

## 2020-07-17 NOTE — ED Notes (Signed)
DC instructions reviewed with patient.  Verbalized understanding with no further needs at this time.  VSS.  Respirations even and unlabored.

## 2020-07-18 ENCOUNTER — Emergency Department: Payer: Medicaid Other

## 2020-07-18 ENCOUNTER — Encounter: Payer: Self-pay | Admitting: Emergency Medicine

## 2020-07-18 ENCOUNTER — Emergency Department
Admission: EM | Admit: 2020-07-18 | Discharge: 2020-07-18 | Disposition: A | Discharge status: Home / Self Care | Payer: Medicaid Other | Attending: Emergency Medicine | Admitting: Emergency Medicine

## 2020-07-18 ENCOUNTER — Observation Stay
Admission: EM | Admit: 2020-07-18 | Discharge: 2020-07-18 | Disposition: A | Discharge status: Home / Self Care | Payer: Medicaid Other | Attending: Obstetrics and Gynecology | Admitting: Obstetrics and Gynecology

## 2020-07-18 ENCOUNTER — Other Ambulatory Visit: Payer: Self-pay

## 2020-07-18 DIAGNOSIS — Z3A21 21 weeks gestation of pregnancy: Secondary | ICD-10-CM | POA: Insufficient documentation

## 2020-07-18 DIAGNOSIS — R1012 Left upper quadrant pain: Secondary | ICD-10-CM | POA: Diagnosis not present

## 2020-07-18 DIAGNOSIS — R059 Cough, unspecified: Secondary | ICD-10-CM | POA: Diagnosis not present

## 2020-07-18 DIAGNOSIS — J45909 Unspecified asthma, uncomplicated: Secondary | ICD-10-CM | POA: Insufficient documentation

## 2020-07-18 DIAGNOSIS — O99512 Diseases of the respiratory system complicating pregnancy, second trimester: Secondary | ICD-10-CM | POA: Insufficient documentation

## 2020-07-18 DIAGNOSIS — Z794 Long term (current) use of insulin: Secondary | ICD-10-CM | POA: Diagnosis not present

## 2020-07-18 DIAGNOSIS — Z7722 Contact with and (suspected) exposure to environmental tobacco smoke (acute) (chronic): Secondary | ICD-10-CM | POA: Diagnosis not present

## 2020-07-18 DIAGNOSIS — R109 Unspecified abdominal pain: Secondary | ICD-10-CM

## 2020-07-18 DIAGNOSIS — O26892 Other specified pregnancy related conditions, second trimester: Secondary | ICD-10-CM | POA: Insufficient documentation

## 2020-07-18 DIAGNOSIS — J90 Pleural effusion, not elsewhere classified: Secondary | ICD-10-CM

## 2020-07-18 DIAGNOSIS — R1032 Left lower quadrant pain: Secondary | ICD-10-CM | POA: Insufficient documentation

## 2020-07-18 DIAGNOSIS — D72829 Elevated white blood cell count, unspecified: Secondary | ICD-10-CM | POA: Insufficient documentation

## 2020-07-18 DIAGNOSIS — E119 Type 2 diabetes mellitus without complications: Secondary | ICD-10-CM | POA: Insufficient documentation

## 2020-07-18 DIAGNOSIS — O24912 Unspecified diabetes mellitus in pregnancy, second trimester: Secondary | ICD-10-CM | POA: Diagnosis not present

## 2020-07-18 MED ORDER — LORAZEPAM 0.5 MG PO TABS
0.5000 mg | ORAL_TABLET | ORAL | Status: AC
  Administered 2020-07-18: 0.5 mg via ORAL
  Filled 2020-07-18: qty 1

## 2020-07-18 MED ORDER — HYDROCODONE-ACETAMINOPHEN 5-325 MG PO TABS
1.0000 | ORAL_TABLET | Freq: Once | ORAL | Status: AC
  Administered 2020-07-18: 1 via ORAL
  Filled 2020-07-18: qty 1

## 2020-07-18 MED ORDER — HYDROCODONE-ACETAMINOPHEN 5-325 MG PO TABS
1.0000 | ORAL_TABLET | Freq: Once | ORAL | Status: AC
Start: 2020-07-18 — End: 2020-07-18
  Administered 2020-07-18: 1 via ORAL
  Filled 2020-07-18: qty 1

## 2020-07-18 NOTE — ED Notes (Signed)
Pt to MRI

## 2020-07-18 NOTE — ED Triage Notes (Signed)
Pt to triage via w/c with no distress noted; G3P2, 21wks (EDC 9/5), pt at [facility] high risk clinic (diabetes & preterm labor); seen in ED last night and tx for "pulled muscle" and received a lidocaine injection--pt reports pain remained after injection; returned later for left side/abd/flank pain; seen and eval by L&D and has returned to ED for further eval
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
## 2020-07-18 NOTE — Discharge Summary (Signed)
    L&D OB Triage Note  SUBJECTIVE Bianca Alvarez is a 35 y.o. [id number] female at [redacted]w[redacted]d, EDD Estimated Date of Delivery: 11/24/20 who presented to triage with complaints of left-sided abdominal pain. Denies LOF, CTX and VB.  OB History  Gravida Para Term Preterm AB Living  3 2 0 2 0 2  SAB IAB Ectopic Multiple Live Births  0 0 0 0 2    # Outcome Date GA Lbr Len/2nd Weight Sex Delivery Anes PTL Lv  3 Current           2 Preterm         LIV  1 Preterm         LIV    No medications prior to admission.     OBJECTIVE  Nursing Evaluation:   BP 132/78   Pulse 89   Temp 98.5 F (36.9 C) (Oral)   Resp (!) 22    Findings:        Pt not in labor     Baby is reassuring     Pt stable      NST was performed and has been reviewed by me.  NST INTERPRETATION: Category I  Mode: Doppler Baseline Rate (A): 140 bpm (fht)           Contraction Frequency (min): none  ASSESSMENT Impression:  1.  Pregnancy:  [id number] at [redacted]w[redacted]d , EDD Estimated Date of Delivery: 11/24/20 2.  Reassuring fetal and maternal status 3.  Abd pain - pt back to ED - MRI and WU - negative.  PLAN 1. Reassurance regarding baby and labor given.  Pt desires to go back to ED for continued WU. 2. Discharge home with standard labor precautions given to return to L&D or call the office for problems. 3. Continue routine prenatal care.

## 2020-07-18 NOTE — OB Triage Note (Signed)
Pt is a 35y/o G3P2 at [redacted]w[redacted]d with c/o left sides abdominal pain that wraps around to the back and rates it 10/10. Pt states it hurts to breathe and move. Pt states +FM. Pt denies LOF, CTX and VB. Monitors applied and assessing. Initial FHT 140.  RN discussed with MD and pt is OB cleared to be discharged.  Pt to be d/c home and to self care. Pt given teaching on when to seek medical attention (LOF, VB and decreased FM, etc..). Pt verbalized understanding of d/c instructions.

## 2021-06-15 ENCOUNTER — Other Ambulatory Visit: Payer: Self-pay

## 2021-06-15 ENCOUNTER — Emergency Department
Admission: EM | Admit: 2021-06-15 | Discharge: 2021-06-15 | Disposition: A | Discharge status: Home / Self Care | Payer: Medicaid Other | Attending: Emergency Medicine | Admitting: Emergency Medicine

## 2021-06-15 ENCOUNTER — Encounter: Payer: Self-pay | Admitting: Emergency Medicine

## 2021-06-15 ENCOUNTER — Emergency Department: Payer: Medicaid Other

## 2021-06-15 DIAGNOSIS — M25559 Pain in unspecified hip: Secondary | ICD-10-CM | POA: Insufficient documentation

## 2021-06-15 DIAGNOSIS — M25562 Pain in left knee: Secondary | ICD-10-CM | POA: Insufficient documentation

## 2021-06-15 MED ORDER — ONDANSETRON 4 MG PO TBDP: 4.0000 mg | ORAL_TABLET | Freq: Three times a day (TID) | ORAL | 0 refills | Status: DC | PRN

## 2021-06-15 MED ORDER — PREDNISONE 10 MG PO TABS: 10.0000 mg | ORAL_TABLET | ORAL | 0 refills | Status: DC

## 2021-06-15 MED ORDER — HYDROCODONE-ACETAMINOPHEN 5-325 MG PO TABS: 1.0000 | ORAL_TABLET | ORAL | 0 refills | Status: DC | PRN

## 2021-06-15 NOTE — ED Triage Notes (Signed)
Pt reports pain to her left knee that radiates into her left hip for about a month and a half. Pt states no obvious injuries ?

## 2021-06-15 NOTE — ED Provider Notes (Signed)
? ?[facility] ?Provider Note ? ?Patient Contact: 1:40 PM (approximate) ? ? ?History  ? ?Knee Pain and Hip Pain ? ? ?HPI ? ?Bianca Alvarez is a 36 y.o. female who presents the emergency department complaining of left lateral knee pain.  Patient has pain along the lateral joint line.  No known injury but states for the past month it is been severe pain.  She states that she has a clicking/catching sensation with extension of her knee sometimes.  Majority of pain comes when going from a bending/flexed position to a standing/extended position.  It is still painful when leg is straight and she is weightbearing but the flexing extending portion is the worst.  No other injury or complaint.  She is already on meloxicam.  She states that she has been referred to orthopedics but has not heard back from them so she presents to the ED for symptom management and hopefully diagnosis. ?  ? ? ?Physical Exam  ? ?Triage Vital Signs: ?ED Triage Vitals  ?Enc Vitals Group  ?   BP 06/15/21 1116 135/90  ?   Pulse Rate 06/15/21 1116 73  ?   Resp 06/15/21 1116 18  ?   Temp 06/15/21 1116 98.1 ?F (36.7 ?C)  ?   Temp Source 06/15/21 1116 Oral  ?   SpO2 06/15/21 1116 98 %  ?   Weight 06/15/21 1114 275 lb 9.2 oz (125 kg)  ?   Height 06/15/21 1114 5\' 7"  (1.702 m)  ?   Head Circumference --   ?   Peak Flow --   ?   Pain Score 06/15/21 1114 8  ?   Pain Loc --   ?   Pain Edu? --   ?   Excl. in GC? --   ? ? ?Most recent vital signs: ?Vitals:  ? 06/15/21 1116  ?BP: 135/90  ?Pulse: 73  ?Resp: 18  ?Temp: 98.1 ?F (36.7 ?C)  ?SpO2: 98%  ? ? ? ?General: Alert and in no acute distress.  ?Cardiovascular:  Good peripheral perfusion ?Respiratory: Normal respiratory effort without tachypnea or retractions. Lungs CTAB.  ?Musculoskeletal: Full range of motion to all extremities.  Visualization of the left knee reveals no gross edema.  No deformity.  Patient is exquisitely tender along the lateral joint line but no other tenderness to  palpation.  Patient is able to extend and flex the knee without weight applied.  Varus, valgus, Lachman's negative, McMurray positive.  Dorsalis pedis pulses sensation intact distally. ?Neurologic:  No gross focal neurologic deficits are appreciated.  ?Skin:   No rash noted ?Other: ? ? ?ED Results / Procedures / Treatments  ? ?Labs ?(all labs ordered are listed, but only abnormal results are displayed) ?Labs Reviewed - No data to display ? ? ?EKG ? ? ? ? ?RADIOLOGY ? ?I personally viewed and evaluated these images as part of my medical decision making, as well as reviewing the written report by the radiologist. ? ?ED Provider Interpretation: No acute traumatic finding, no effusion identified ? ?DG Knee Complete 4 Views Left ? ?Result Date: 06/15/2021 ?CLINICAL DATA:  Left knee pain for a month and a half, no known trauma EXAM: LEFT KNEE - COMPLETE 4+ VIEW COMPARISON:  None. FINDINGS: No evidence of fracture, dislocation, or joint effusion. No evidence of arthropathy or other focal bone abnormality. Soft tissues are unremarkable. IMPRESSION: No fracture or dislocation of the left knee. Joint spaces are preserved. No knee joint effusion. Electronically Signed  By: Delanna Ahmadi M.D.   On: 06/15/2021 13:04   ? ?PROCEDURES: ? ?Critical Care performed: No ? ?Procedures ? ? ?MEDICATIONS ORDERED IN ED: ?Medications - No data to display ? ? ?IMPRESSION / MDM / ASSESSMENT AND PLAN / ED COURSE  ?I reviewed the triage vital signs and the nursing notes. ?             ?               ? ?Differential diagnosis includes, but is not limited to, fracture, arthritis, joint effusion, gout, meniscal tear ? ? ?Patient's diagnosis is consistent with acute left knee pain.  Patient presents emergency department with pain along the lateral joint line.  Given the patient's reported symptoms, tenderness, positive McMurray test I do have a suspicion for meniscal injury.  There was no acute injury precipitating the patient's symptoms.  X-ray is  reassuring no fractures or signs of progressed osteoarthritis.  Patient is already on meloxicam so I will add a course of steroid, administer a hinged knee brace and refer the patient to orthopedics for further management..  Patient is given ED precautions to return to the ED for any worsening or new symptoms. ? ? ? ?  ? ? ?FINAL CLINICAL IMPRESSION(S) / ED DIAGNOSES  ? ?Final diagnoses:  ?Acute pain of left knee  ? ? ? ?Rx / DC Orders  ? ?ED Discharge Orders   ? ?      Ordered  ?  predniSONE (DELTASONE) 10 MG tablet  As directed       ?Note to Pharmacy: Take on a pattern of 6, 6, 5, 5, 4, 4, 3, 3, 2, 2, 1, 1  ? 06/15/21 1342  ?  HYDROcodone-acetaminophen (NORCO/VICODIN) 5-325 MG tablet  Every 4 hours PRN       ? 06/15/21 1342  ? ?  ?  ? ?  ? ? ? ?Note:  This document was prepared using Dragon voice recognition software and may include unintentional dictation errors. ?  ?Darletta Moll, PA-C ?06/15/21 1405 ? ?  ?Vladimir Crofts, MD ?06/15/21 1516 ? ?

## 2021-07-29 ENCOUNTER — Emergency Department
Admission: EM | Admit: 2021-07-29 | Discharge: 2021-07-29 | Disposition: A | Discharge status: Other Acute Inpatient Hospital | Payer: Medicaid Other | Source: Home / Self Care | Attending: Emergency Medicine | Admitting: Emergency Medicine

## 2021-07-29 ENCOUNTER — Encounter: Payer: Self-pay | Admitting: Psychiatry

## 2021-07-29 ENCOUNTER — Inpatient Hospital Stay
Admission: AD | Admit: 2021-07-29 | Discharge: 2021-07-30 | DRG: 882 | Disposition: A | Discharge status: Home / Self Care | Payer: Medicaid Other | Source: Intra-hospital | Attending: Psychiatry | Admitting: Psychiatry

## 2021-07-29 ENCOUNTER — Other Ambulatory Visit: Payer: Self-pay

## 2021-07-29 DIAGNOSIS — F419 Anxiety disorder, unspecified: Secondary | ICD-10-CM | POA: Insufficient documentation

## 2021-07-29 DIAGNOSIS — T424X2A Poisoning by benzodiazepines, intentional self-harm, initial encounter: Secondary | ICD-10-CM | POA: Diagnosis present

## 2021-07-29 DIAGNOSIS — E119 Type 2 diabetes mellitus without complications: Secondary | ICD-10-CM | POA: Diagnosis present

## 2021-07-29 DIAGNOSIS — Y900 Blood alcohol level of less than 20 mg/100 ml: Secondary | ICD-10-CM | POA: Insufficient documentation

## 2021-07-29 DIAGNOSIS — F1010 Alcohol abuse, uncomplicated: Secondary | ICD-10-CM | POA: Diagnosis present

## 2021-07-29 DIAGNOSIS — G4700 Insomnia, unspecified: Secondary | ICD-10-CM | POA: Diagnosis present

## 2021-07-29 DIAGNOSIS — Z91411 Personal history of adult psychological abuse: Secondary | ICD-10-CM | POA: Insufficient documentation

## 2021-07-29 DIAGNOSIS — T43501A Poisoning by unspecified antipsychotics and neuroleptics, accidental (unintentional), initial encounter: Secondary | ICD-10-CM | POA: Diagnosis present

## 2021-07-29 DIAGNOSIS — F329 Major depressive disorder, single episode, unspecified: Secondary | ICD-10-CM | POA: Diagnosis present

## 2021-07-29 DIAGNOSIS — F332 Major depressive disorder, recurrent severe without psychotic features: Secondary | ICD-10-CM | POA: Insufficient documentation

## 2021-07-29 DIAGNOSIS — F1410 Cocaine abuse, uncomplicated: Secondary | ICD-10-CM | POA: Insufficient documentation

## 2021-07-29 DIAGNOSIS — Z20822 Contact with and (suspected) exposure to covid-19: Secondary | ICD-10-CM | POA: Insufficient documentation

## 2021-07-29 DIAGNOSIS — Z794 Long term (current) use of insulin: Secondary | ICD-10-CM | POA: Diagnosis not present

## 2021-07-29 DIAGNOSIS — J45909 Unspecified asthma, uncomplicated: Secondary | ICD-10-CM | POA: Insufficient documentation

## 2021-07-29 DIAGNOSIS — F4310 Post-traumatic stress disorder, unspecified: Secondary | ICD-10-CM | POA: Insufficient documentation

## 2021-07-29 DIAGNOSIS — F4325 Adjustment disorder with mixed disturbance of emotions and conduct: Principal | ICD-10-CM | POA: Diagnosis present

## 2021-07-29 DIAGNOSIS — T43502A Poisoning by unspecified antipsychotics and neuroleptics, intentional self-harm, initial encounter: Secondary | ICD-10-CM | POA: Insufficient documentation

## 2021-07-29 DIAGNOSIS — R451 Restlessness and agitation: Secondary | ICD-10-CM | POA: Insufficient documentation

## 2021-07-29 DIAGNOSIS — R000 Tachycardia, unspecified: Secondary | ICD-10-CM | POA: Insufficient documentation

## 2021-07-29 LAB — CBC
HCT: 43.8 % (ref 36.0–46.0)
Hemoglobin: 13.9 g/dL (ref 12.0–15.0)
MCH: 26.1 pg (ref 26.0–34.0)
MCHC: 31.7 g/dL (ref 30.0–36.0)
MCV: 82.2 fL (ref 80.0–100.0)
Platelets: 445 10*3/uL — ABNORMAL HIGH (ref 150–400)
RBC: 5.33 MIL/uL — ABNORMAL HIGH (ref 3.87–5.11)
RDW: 13.3 % (ref 11.5–15.5)
WBC: 13.2 10*3/uL — ABNORMAL HIGH (ref 4.0–10.5)
nRBC: 0 % (ref 0.0–0.2)

## 2021-07-29 LAB — RESP PANEL BY RT-PCR (FLU A&B, COVID) ARPGX2
Influenza A by PCR: NEGATIVE
Influenza B by PCR: NEGATIVE
SARS Coronavirus 2 by RT PCR: NEGATIVE

## 2021-07-29 LAB — ETHANOL: Alcohol, Ethyl (B): <10 mg/dL (ref <10)

## 2021-07-29 LAB — COMPREHENSIVE METABOLIC PANEL
ALT: 24 U/L (ref 0–44)
AST: 22 U/L (ref 15–41)
Albumin: 4.2 g/dL (ref 3.5–5.0)
Alkaline Phosphatase: 65 U/L (ref 38–126)
Anion gap: 11 (ref 5–15)
BUN: 20 mg/dL (ref 6–20)
CO2: 24 mmol/L (ref 22–32)
Calcium: 9.4 mg/dL (ref 8.9–10.3)
Chloride: 100 mmol/L (ref 98–111)
Creatinine, Ser: 0.75 mg/dL (ref 0.44–1.00)
GFR, Estimated: >60 mL/min (ref >60)
Glucose, Bld: 281 mg/dL — ABNORMAL HIGH (ref 70–99)
Potassium: 3.9 mmol/L (ref 3.5–5.1)
Sodium: 135 mmol/L (ref 135–145)
Total Bilirubin: 0.7 mg/dL (ref 0.3–1.2)
Total Protein: 8.4 g/dL — ABNORMAL HIGH (ref 6.5–8.1)

## 2021-07-29 LAB — SALICYLATE LEVEL: Salicylate Lvl: <7 mg/dL — ABNORMAL LOW (ref 7.0–30.0)

## 2021-07-29 LAB — ACETAMINOPHEN LEVEL: Acetaminophen (Tylenol), Serum: <10 ug/mL — ABNORMAL LOW (ref 10–30)

## 2021-07-29 MED ORDER — ACETAMINOPHEN 500 MG PO TABS: 1000.0000 mg | ORAL_TABLET | Freq: Four times a day (QID) | ORAL | Status: DC | PRN

## 2021-07-29 MED ORDER — HYDROXYZINE HCL 10 MG PO TABS: 15.0000 mg | ORAL_TABLET | Freq: Every day | ORAL | Status: DC

## 2021-07-29 MED ORDER — MAGNESIUM HYDROXIDE 400 MG/5ML PO SUSP: 30.0000 mL | Freq: Every day | ORAL | Status: DC | PRN

## 2021-07-29 MED ORDER — LORAZEPAM 1 MG PO TABS
1.0000 mg | ORAL_TABLET | ORAL | Status: AC
  Administered 2021-07-29: 1 mg via ORAL
  Filled 2021-07-29: qty 1

## 2021-07-29 MED ORDER — IBUPROFEN 600 MG PO TABS: 600.0000 mg | ORAL_TABLET | Freq: Three times a day (TID) | ORAL | Status: DC | PRN

## 2021-07-29 MED ORDER — ALBUTEROL SULFATE HFA 108 (90 BASE) MCG/ACT IN AERS: 2.0000 | INHALATION_SPRAY | Freq: Four times a day (QID) | RESPIRATORY_TRACT | Status: DC | PRN

## 2021-07-29 MED ORDER — ESCITALOPRAM OXALATE 10 MG PO TABS
20.0000 mg | ORAL_TABLET | Freq: Every morning | ORAL | Status: DC
  Administered 2021-07-30: 20 mg via ORAL
  Filled 2021-07-29: qty 2

## 2021-07-29 MED ORDER — MIRTAZAPINE 15 MG PO TABS: 7.5000 mg | ORAL_TABLET | Freq: Every day | ORAL | Status: DC

## 2021-07-29 MED ORDER — LURASIDONE HCL 40 MG PO TABS
80.0000 mg | ORAL_TABLET | Freq: Every evening | ORAL | Status: DC
  Administered 2021-07-30: 80 mg via ORAL
  Filled 2021-07-29: qty 2

## 2021-07-29 MED ORDER — ONDANSETRON 4 MG PO TBDP: 4.0000 mg | ORAL_TABLET | Freq: Three times a day (TID) | ORAL | Status: DC | PRN

## 2021-07-29 MED ORDER — GABAPENTIN 300 MG PO CAPS
900.0000 mg | ORAL_CAPSULE | Freq: Every day | ORAL | Status: DC
  Administered 2021-07-30: 900 mg via ORAL
  Filled 2021-07-29: qty 3

## 2021-07-29 MED ORDER — HYDROXYZINE HCL 10 MG PO TABS: 5.0000 mg | ORAL_TABLET | Freq: Every morning | ORAL | Status: DC

## 2021-07-29 MED ORDER — CLONAZEPAM 0.5 MG PO TABS
0.5000 mg | ORAL_TABLET | Freq: Two times a day (BID) | ORAL | Status: DC
  Administered 2021-07-30 (×2): 0.5 mg via ORAL
  Filled 2021-07-29 (×3): qty 1

## 2021-07-29 MED ORDER — ALUM & MAG HYDROXIDE-SIMETH 200-200-20 MG/5ML PO SUSP
30.0000 mL | Freq: Four times a day (QID) | ORAL | Status: DC | PRN
Start: 2021-07-29 — End: 2021-07-30

## 2021-07-29 MED ORDER — ALUM & MAG HYDROXIDE-SIMETH 200-200-20 MG/5ML PO SUSP: 30.0000 mL | Freq: Four times a day (QID) | ORAL | Status: DC | PRN
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
## 2021-07-29 NOTE — ED Notes (Signed)
IVC/Consult Pending/ Moved to BHU-5 ?

## 2021-07-29 NOTE — ED Notes (Signed)
Pt refused a snack and drink at this time.  ?

## 2021-07-29 NOTE — ED Notes (Signed)
Report received from Amy, RN including SBAR. Patient alert and oriented, warm and dry, and in no acute distress. Patient denies SI, HI, AVH and pain. Patient made aware of Q15 minute rounds and Rover and Officer presence for their safety. Patient instructed to come to this nurse with needs or concerns.  °

## 2021-07-29 NOTE — ED Notes (Signed)
Report received from [facility]. Patient to me moved to [facility] room 5 after medication administered and COVID swab obtained.  ?

## 2021-07-29 NOTE — BH Assessment (Addendum)
Patient is to be admitted to [facility] by Psychiatric Nurse Practitioner Gillermo Murdoch.  ?Attending Physician will be Dr.  Toni Amend .   ?Patient has been assigned to room 305, by [facility] Charge Nurse [place].   ?Intake Paper Work has been signed and placed on patient chart.  ?ER staff is aware of the admission: ?French Ana, ER Secretary   ?Dr. Scotty Court, ER MD  ?Selena Batten, Patient's Nurse  ?Rosey Bath, Patient Access.  ? ?Pt can be transported as soon orders have been placed.  ?
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
## 2021-07-29 NOTE — ED Notes (Signed)
Pt. To BHU from ED ambulatory without difficulty, to room  BHU 5. Report from Chris RN. Pt. Is alert and oriented, warm and dry in no distress. Pt. Denies SI, HI, and AVH. Pt. Calm and cooperative. Pt. Made aware of security cameras and Q15 minute rounds. Pt. Encouraged to let Nursing staff know of any concerns or needs.    ENVIRONMENTAL ASSESSMENT Potentially harmful objects out of patient reach: Yes.   Personal belongings secured: Yes.   Patient dressed in hospital provided attire only: Yes.   Plastic bags out of patient reach: Yes.   Patient care equipment (cords, cables, call bells, lines, and drains) shortened, removed, or accounted for: Yes.   Equipment and supplies removed from bottom of stretcher: Yes.   Potentially toxic materials out of patient reach: Yes.   Sharps container removed or out of patient reach: Yes.    
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
## 2021-07-29 NOTE — ED Notes (Signed)
Psych NP and TTS at bedside for consult.  ?

## 2021-07-29 NOTE — ED Notes (Signed)
Pt dressed out into hospital attire. Pt belongings to include: ?1 blue shirt ?1 jean shorts ?1 hair tie ?2 black flip flops ?1 silver necklace ?1 purple bra ?1 black underwear ?1 black phone ? ?Unable to get nose ring out at this time ? ?

## 2021-07-30 ENCOUNTER — Other Ambulatory Visit: Payer: Self-pay

## 2021-07-30 DIAGNOSIS — F4325 Adjustment disorder with mixed disturbance of emotions and conduct: Secondary | ICD-10-CM | POA: Diagnosis not present

## 2021-07-30 DIAGNOSIS — E119 Type 2 diabetes mellitus without complications: Secondary | ICD-10-CM

## 2021-07-30 MED ORDER — QUETIAPINE FUMARATE 100 MG PO TABS
100.0000 mg | ORAL_TABLET | Freq: Every day | ORAL | Status: DC
Start: 2021-07-30 — End: 2021-07-30

## 2021-07-30 MED ORDER — MAGNESIUM HYDROXIDE 400 MG/5ML PO SUSP: 30.0000 mL | Freq: Every day | ORAL | Status: DC | PRN

## 2021-07-30 MED ORDER — INSULIN GLARGINE-YFGN 100 UNIT/ML ~~LOC~~ SOLN
55.0000 [IU] | Freq: Every day | SUBCUTANEOUS | Status: DC
  Filled 2021-07-30: qty 0.55

## 2021-07-30 MED ORDER — ALUM & MAG HYDROXIDE-SIMETH 200-200-20 MG/5ML PO SUSP: 30.0000 mL | ORAL | Status: DC | PRN

## 2021-07-30 MED ORDER — QUETIAPINE FUMARATE 100 MG PO TABS: 100.0000 mg | ORAL_TABLET | Freq: Every day | ORAL | 0 refills | Status: AC

## 2021-07-30 MED ORDER — CLONAZEPAM 0.5 MG PO TABS
0.5000 mg | ORAL_TABLET | ORAL | Status: AC
  Administered 2021-07-30: 0.5 mg via ORAL

## 2021-07-30 MED ORDER — ACETAMINOPHEN 325 MG PO TABS: 650.0000 mg | ORAL_TABLET | Freq: Four times a day (QID) | ORAL | Status: DC | PRN

## 2021-07-30 NOTE — Plan of Care (Signed)
Patient new to the unit tonight, hasn't had time to progress ? ?Problem: Education: ?Goal: Knowledge of [facility] Education information/materials will improve ?Outcome: Not Progressing ?Goal: Emotional status will improve ?Outcome: Not Progressing ?Goal: Mental status will improve ?Outcome: Not Progressing ?Goal: Verbalization of understanding the information provided will improve ?Outcome: Not Progressing ?  ?Problem: Safety: ?Goal: Periods of time without injury will increase ?Outcome: Not Progressing ?  ?Problem: Education: ?Goal: Utilization of techniques to improve thought processes will improve ?Outcome: Not Progressing ?Goal: Knowledge of the prescribed therapeutic regimen will improve ?Outcome: Not Progressing ?  ?Problem: Safety: ?Goal: Ability to disclose and discuss suicidal ideas will improve ?Outcome: Not Progressing ?Goal: Ability to identify and utilize support systems that promote safety will improve ?Outcome: Not Progressing ?  ?Problem: Education: ?Goal: Knowledge of [facility] Education information/materials will improve ?Outcome: Not Progressing ?Goal: Emotional status will improve ?Outcome: Not Progressing ?Goal: Mental status will improve ?Outcome: Not Progressing ?Goal: Verbalization of understanding the information provided will improve ?Outcome: Not Progressing ?  ?Problem: Safety: ?Goal: Periods of time without injury will increase ?Outcome: Not Progressing ?  ?Problem: Education: ?Goal: Utilization of techniques to improve thought processes will improve ?Outcome: Not Progressing ?Goal: Knowledge of the prescribed therapeutic regimen will improve ?Outcome: Not Progressing ?  ?Problem: Safety: ?Goal: Ability to disclose and discuss suicidal ideas will improve ?Outcome: Not Progressing ?Goal: Ability to identify and utilize support systems that promote safety will improve ?Outcome: Not Progressing ?  ?Problem: Education: ?Goal: Emotional status will improve ?Outcome: Not  Progressing ?  ?Problem: Safety: ?Goal: Periods of time without injury will increase ?Outcome: Not Progressing ?  ?Problem: Education: ?Goal: Knowledge of [facility] Education information/materials will improve ?Outcome: Not Progressing ?  ?

## 2021-07-30 NOTE — Group Note (Signed)
[place] LCSW Group Therapy Note ? ? ? ?Group Date: 07/30/2021 ?Start Time: 1330 ?End Time: 1430 ? ?Type of Therapy and Topic:  Group Therapy:  Overcoming Obstacles ? ?Participation Level:  [facility] PARTICIPATION LEVEL: Did Not Attend ? ?Mood: ? ?Description of Group:   ?In this group patients will be encouraged to explore what they see as obstacles to their own wellness and recovery. They will be guided to discuss their thoughts, feelings, and behaviors related to these obstacles. The group will process together ways to cope with barriers, with attention given to specific choices patients can make. Each patient will be challenged to identify changes they are motivated to make in order to overcome their obstacles. This group will be process-oriented, with patients participating in exploration of their own experiences as well as giving and receiving support and challenge from other group members. ? ?Therapeutic Goals: ?1. Patient will identify personal and current obstacles as they relate to admission. ?2. Patient will identify barriers that currently interfere with their wellness or overcoming obstacles.  ?3. Patient will identify feelings, thought process and behaviors related to these barriers. ?4. Patient will identify two changes they are willing to make to overcome these obstacles:  ? ? ?Summary of Patient Progress ? ? ?X ? ? ?Therapeutic Modalities:   ?Cognitive Behavioral Therapy ?Solution Focused Therapy ?Motivational Interviewing ?Relapse Prevention Therapy ? ? ?Rozann Lesches, LCSW ?
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
## 2021-07-30 NOTE — BH IP Treatment Plan (Signed)
Interdisciplinary Treatment and Diagnostic Plan Update ? ?07/30/2021 ?Time of Session: 0930 ?Bianca Alvarez ?MRN: [id number] ? ?Principal Diagnosis: Adjustment disorder with mixed disturbance of emotions and conduct ? ?Secondary Diagnoses: Principal Problem: ?  Adjustment disorder with mixed disturbance of emotions and conduct ?Active Problems: ?  Diabetes (HCC) ? ? ?Current Medications:  ?Current Facility-Administered Medications  ?Medication Dose Route Frequency Provider Last Rate Last Admin  ? acetaminophen (TYLENOL) tablet 1,000 mg  1,000 mg Oral Q6H PRN Gillermo Murdoch, NP      ? acetaminophen (TYLENOL) tablet 650 mg  650 mg Oral Q6H PRN Clapacs, Jackquline Denmark, MD      ? albuterol (VENTOLIN HFA) 108 (90 Base) MCG/ACT inhaler 2 puff  2 puff Inhalation Q6H PRN Gillermo Murdoch, NP      ? alum & mag hydroxide-simeth (MAALOX/MYLANTA) 200-200-20 MG/5ML suspension 30 mL  30 mL Oral Q6H PRN Gillermo Murdoch, NP      ? alum & mag hydroxide-simeth (MAALOX/MYLANTA) 200-200-20 MG/5ML suspension 30 mL  30 mL Oral Q4H PRN Clapacs, Jackquline Denmark, MD      ? clonazePAM Scarlette Calico) tablet 0.5 mg  0.5 mg Oral BID Gillermo Murdoch, NP   0.5 mg at 07/30/21 1631  ? escitalopram (LEXAPRO) tablet 20 mg  20 mg Oral q morning Gillermo Murdoch, NP   20 mg at 07/30/21 1000  ? gabapentin (NEURONTIN) capsule 900 mg  900 mg Oral Daily Gillermo Murdoch, NP   900 mg at 07/30/21 0815  ? insulin glargine-yfgn (SEMGLEE) injection 55 Units  55 Units Subcutaneous QHS Clapacs, John T, MD      ? lurasidone (LATUDA) tablet 80 mg  80 mg Oral QPM Gillermo Murdoch, NP   80 mg at 07/30/21 1631  ? magnesium hydroxide (MILK OF MAGNESIA) suspension 30 mL  30 mL Oral Daily PRN Gillermo Murdoch, NP      ? magnesium hydroxide (MILK OF MAGNESIA) suspension 30 mL  30 mL Oral Daily PRN Clapacs, Jackquline Denmark, MD      ? mirtazapine (REMERON) tablet 7.5 mg  7.5 mg Oral QHS Gillermo Murdoch, NP      ? ondansetron (ZOFRAN-ODT) disintegrating tablet 4  mg  4 mg Oral Q8H PRN Gillermo Murdoch, NP      ? QUEtiapine (SEROQUEL) tablet 100 mg  100 mg Oral QHS Clapacs, Jackquline Denmark, MD      ? ?PTA Medications: ?Medications Prior to Admission  ?Medication Sig Dispense Refill Last Dose  ? acetaminophen (TYLENOL) 500 MG tablet Take 1,000 mg by mouth every 6 (six) hours as needed for mild pain or moderate pain.     ? albuterol (VENTOLIN HFA) 108 (90 Base) MCG/ACT inhaler Inhale 2 puffs into the lungs every 6 (six) hours as needed for wheezing or shortness of breath. 18 g 2   ? clonazePAM (KLONOPIN) 0.5 MG tablet Take 0.5 mg by mouth 2 (two) times daily.     ? escitalopram (LEXAPRO) 20 MG tablet Take 20 mg by mouth every morning.     ? gabapentin (NEURONTIN) 300 MG capsule Take 900 mg by mouth daily.   0   ? HYDROcodone-acetaminophen (NORCO/VICODIN) 5-325 MG tablet Take 1 tablet by mouth every 4 (four) hours as needed for moderate pain. 10 tablet 0   ? hydrOXYzine (ATARAX) 10 MG tablet Take 5-15 mg by mouth 2 (two) times daily.     ? LANTUS SOLOSTAR 100 UNIT/ML Solostar Pen Inject 55 Units into the skin every evening.     ? lurasidone (LATUDA) 80 MG  TABS tablet Take 80 mg by mouth every evening.     ? meloxicam (MOBIC) 7.5 MG tablet Take 7.5 mg by mouth 2 (two) times daily as needed.     ? mirtazapine (REMERON) 15 MG tablet Take 7.5-15 mg by mouth at bedtime.     ? ondansetron (ZOFRAN-ODT) 4 MG disintegrating tablet Take 1 tablet (4 mg total) by mouth every 8 (eight) hours as needed for nausea or vomiting. 20 tablet 0   ? predniSONE (DELTASONE) 10 MG tablet Take 1 tablet (10 mg total) by mouth as directed. 42 tablet 0   ? SUBOXONE 8-2 MG FILM Place 1 strip under the tongue 2 (two) times daily.     ? ? ?Patient Stressors:   ? ?Patient Strengths:   ? ?Treatment Modalities: Medication Management, Group therapy, Case management,  ?1 to 1 session with clinician, Psychoeducation, Recreational therapy. ? ? ?Physician Treatment Plan for Primary Diagnosis: Adjustment disorder with mixed  disturbance of emotions and conduct ?Long Term Goal(s): Improvement in symptoms so as ready for discharge  ? ?Short Term Goals: Ability to maintain clinical measurements within normal limits will improve ?Ability to demonstrate self-control will improve ? ?Medication Management: Evaluate patient's response, side effects, and tolerance of medication regimen. ? ?Therapeutic Interventions: 1 to 1 sessions, Unit Group sessions and Medication administration. ? ?Evaluation of Outcomes: Adequate for Discharge ? ?Physician Treatment Plan for Secondary Diagnosis: Principal Problem: ?  Adjustment disorder with mixed disturbance of emotions and conduct ?Active Problems: ?  Diabetes (HCC) ? ?Long Term Goal(s): Improvement in symptoms so as ready for discharge  ? ?Short Term Goals: Ability to maintain clinical measurements within normal limits will improve ?Ability to demonstrate self-control will improve    ? ?Medication Management: Evaluate patient's response, side effects, and tolerance of medication regimen. ? ?Therapeutic Interventions: 1 to 1 sessions, Unit Group sessions and Medication administration. ? ?Evaluation of Outcomes: Adequate for Discharge ? ? ?RN Treatment Plan for Primary Diagnosis: Adjustment disorder with mixed disturbance of emotions and conduct ?Long Term Goal(s): Knowledge of disease and therapeutic regimen to maintain health will improve ? ?Short Term Goals: Ability to remain free from injury will improve, Ability to verbalize frustration and anger appropriately will improve, Ability to demonstrate self-control, Ability to participate in decision making will improve, Ability to verbalize feelings will improve, Ability to disclose and discuss suicidal ideas, Ability to identify and develop effective coping behaviors will improve, and Compliance with prescribed medications will improve ? ?Medication Management: RN will administer medications as ordered by provider, will assess and evaluate patient's  response and provide education to patient for prescribed medication. RN will report any adverse and/or side effects to prescribing provider. ? ?Therapeutic Interventions: 1 on 1 counseling sessions, Psychoeducation, Medication administration, Evaluate responses to treatment, Monitor vital signs and CBGs as ordered, Perform/monitor CIWA, COWS, AIMS and Fall Risk screenings as ordered, Perform wound care treatments as ordered. ? ?Evaluation of Outcomes: Adequate for Discharge ? ? ?LCSW Treatment Plan for Primary Diagnosis: Adjustment disorder with mixed disturbance of emotions and conduct ?Long Term Goal(s): Safe transition to appropriate next level of care at discharge, Engage patient in therapeutic group addressing interpersonal concerns. ? ?Short Term Goals: Engage patient in aftercare planning with referrals and resources, Increase social support, Increase ability to appropriately verbalize feelings, Increase emotional regulation, Facilitate acceptance of mental health diagnosis and concerns, Facilitate patient progression through stages of change regarding substance use diagnoses and concerns, Identify triggers associated with mental health/substance abuse issues, and Increase skills  for wellness and recovery ? ?Therapeutic Interventions: Assess for all discharge needs, 1 to 1 time with Child psychotherapist, Explore available resources and support systems, Assess for adequacy in community support network, Educate family and significant other(s) on suicide prevention, Complete Psychosocial Assessment, Interpersonal group therapy. ? ?Evaluation of Outcomes: Adequate for Discharge ? ? ?Progress in Treatment: ?Attending groups: No. ?Participating in groups: No. ?Taking medication as prescribed: Yes. ?Toleration medication: Yes. ?Family/Significant other contact made: No, will contact:  Patient has declined consent for CSW to reach family/friend. ?Patient understands diagnosis: Yes. ?Discussing patient identified  problems/goals with staff: Yes. ?Medical problems stabilized or resolved: Yes. ?Denies suicidal/homicidal ideation: Yes. ?Issues/concerns per patient self-inventory: Yes. ?Other: none ? ?New problem(s) identified: N
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
## 2021-07-30 NOTE — [facility] Suicide Risk Assessment (Signed)
[facility] INPATIENT:  Family/Significant Other Suicide Prevention Education ? ?Suicide Prevention Education:  ?Patient Refusal for Family/Significant Other Suicide Prevention Education: The patient Bianca Alvarez has refused to provide written consent for family/significant other to be provided Family/Significant Other Suicide Prevention Education during admission and/or prior to discharge.  Physician notified. ? ?CSW completed SPE with patient. Discussed potential triggers leading to suicidal ideation in addition to coping skills one might use in order to delay and distract self from self harming behaviors. CSW encouraged patient to utilize emergency services if they felt unable to maintain their safety. SPE flyer provided to patient at this time.  ? ?Corky Crafts ?07/30/2021, 2:55 PM ?

## 2021-07-30 NOTE — [facility] Suicide Risk Assessment (Signed)
[facility] Discharge Suicide Risk Assessment ? ? ?Principal Problem: Adjustment disorder with mixed disturbance of emotions and conduct ?Discharge Diagnoses: Principal Problem: ?  Adjustment disorder with mixed disturbance of emotions and conduct ?Active Problems: ?  Diabetes (HCC) ? ? ?Total Time spent with patient: 30 minutes ? ?Musculoskeletal: ?Strength & Muscle Tone: within normal limits ?Gait & Station: normal ?Patient leans: N/A ? ?Psychiatric Specialty Exam ? ?Presentation  ?General Appearance: Appropriate for Environment ? ?Eye Contact:Good ? ?Speech:Clear and Coherent ? ?Speech Volume:Normal ? ?Handedness:Right ? ? ?Mood and Affect  ?Mood:Depressed; Anxious ? ?Duration of Depression Symptoms: No data recorded ?Affect:Blunt; Constricted; Depressed; Flat ? ? ?Thought Process  ?Thought Processes:Coherent ? ?Descriptions of Associations:Intact ? ?Orientation:Full (Time, Place and Person) ? ?Thought Content:Logical ? ?History of Schizophrenia/Schizoaffective disorder:No ? ?Duration of Psychotic Symptoms:No data recorded ?Hallucinations:Hallucinations: None ? ?Ideas of Reference:None ? ?Suicidal Thoughts:Suicidal Thoughts: No ? ?Homicidal Thoughts:Homicidal Thoughts: No ? ? ?Sensorium  ?Memory:Immediate Good; Recent Good; Remote Good ? ?Judgment:Fair ? ?Insight:Fair ? ? ?Executive Functions  ?Concentration:Good ? ?Attention Span:Fair ? ?Recall:Good ? ?Fund of Knowledge:Good ? ?Language:Good ? ? ?Psychomotor Activity  ?Psychomotor Activity:Psychomotor Activity: Normal ? ? ?Assets  ?Assets:Communication Skills; Desire for Improvement; Leisure Time; Physical Health; Resilience; Social Support ? ? ?Sleep  ?Sleep:Sleep: Poor ?Number of Hours of Sleep: 1 ? ? ?Physical Exam: ?Physical Exam ?Vitals and nursing note reviewed.  ?Constitutional:   ?   Appearance: Normal appearance.  ?HENT:  ?   Head: Normocephalic and atraumatic.  ?   Mouth/Throat:  ?   Pharynx: Oropharynx is clear.  ?Eyes:  ?   Pupils: Pupils are equal, round,  and reactive to light.  ?Cardiovascular:  ?   Rate and Rhythm: Normal rate and regular rhythm.  ?Pulmonary:  ?   Effort: Pulmonary effort is normal.  ?   Breath sounds: Normal breath sounds.  ?Abdominal:  ?   General: Abdomen is flat.  ?   Palpations: Abdomen is soft.  ?Musculoskeletal:     ?   General: Normal range of motion.  ?Skin: ?   General: Skin is warm and dry.  ?Neurological:  ?   General: No focal deficit present.  ?   Mental Status: She is alert. Mental status is at baseline.  ?Psychiatric:     ?   Attention and Perception: Attention normal.     ?   Mood and Affect: Mood is anxious. Affect is tearful.     ?   Speech: Speech normal.     ?   Behavior: Behavior normal.     ?   Thought Content: Thought content normal.     ?   Cognition and Memory: Cognition normal.     ?   Judgment: Judgment is impulsive.  ? ?Review of Systems  ?Constitutional: Negative.   ?HENT: Negative.    ?Eyes: Negative.   ?Respiratory: Negative.    ?Cardiovascular: Negative.   ?Gastrointestinal: Negative.   ?Musculoskeletal: Negative.   ?Skin: Negative.   ?Neurological: Negative.   ?Psychiatric/Behavioral:  Positive for substance abuse. Negative for depression, hallucinations and suicidal ideas. The patient is nervous/anxious and has insomnia.   ?Blood pressure (!) 139/110, pulse 91, temperature 98.2 ?F (36.8 ?C), resp. rate 18, height 5\' 7"  (1.702 m), weight 125 kg, SpO2 97 %, unknown if currently breastfeeding. Body mass index is 43.16 kg/m?. ? ?Mental Status Per Nursing Assessment::   ?On Admission:  NA ? ?Demographic Factors:  ?Caucasian and Living alone ? ?Loss Factors: ?Loss of significant relationship ? ?  Historical Factors: ?Impulsivity ? ?Risk Reduction Factors:   ?Responsible for children under 67 years of age, Sense of responsibility to family, and Positive therapeutic relationship ? ?Continued Clinical Symptoms:  ?Depression:   Impulsivity ? ?Cognitive Features That Contribute To Risk:  ?None   ? ?Suicide Risk:  ?Minimal: No  identifiable suicidal ideation.  Patients presenting with no risk factors but with morbid ruminations; may be classified as minimal risk based on the severity of the depressive symptoms ? ? ? ?Plan Of Care/Follow-up recommendations:  ?Patient denies suicidal ideation.  No evidence of psychosis.  No evidence of any suicide attempt.  Requesting discharge.  Has a follow-up plan for outpatient treatment and a place to stay.  Prescription for Seroquel will be added. ? ?Mordecai Rasmussen, MD ?07/30/2021, 2:46 PM ?
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
## 2021-07-30 NOTE — Progress Notes (Signed)
Recreation Therapy Notes ? ? ?Date: 07/30/2021 ? ?Time: 10:15 am   ? ?Location: Courtyard   ? ?Behavioral response: N/A ?  ?Intervention Topic: Leisure   ? ?Discussion/Intervention: ?Patient refused to attend group.  ? ?Clinical Observations/Feedback:  ?Patient refused to attend group.  ?  ?Toshiro Hanken LRT/CTRS ? ? ? ? ? ? ? ?Teauna Dubach ?07/30/2021 12:14 PM ?

## 2021-07-30 NOTE — Progress Notes (Signed)
Patient states " I am sad but I am alright." Patient denies SI,HI and AVH. Verbalized understanding discharge instructions, follow up care and prescriptions. All belongings returned from the [facility] locker. Patient escorted out by staff & transported by cab. ?

## 2021-07-30 NOTE — Discharge Summary (Signed)
Physician Discharge Summary Note ? ?Patient:  Bianca Alvarez is an 36 y.o., female ?MRN:  [id number] ?DOB:  1985-05-27 ?Patient phone:  [phone] (home)  ?Patient address:   ?[address] ?[place] [id number],  ?Total Time spent with patient: 30 minutes ? ?Date of Admission:  07/29/2021 ?Date of Discharge: 07/30/2021 ? ?Reason for Admission: Admitted after presentation to the emergency room with reports of overuse of clonazepam and recent severe trauma ? ?Principal Problem: Adjustment disorder with mixed disturbance of emotions and conduct ?Discharge Diagnoses: Principal Problem: ?  Adjustment disorder with mixed disturbance of emotions and conduct ?Active Problems: ?  Diabetes (HCC) ? ? ?Past Psychiatric History: History of anxiety and depression and substance abuse ? ?Past Medical History:  ?Past Medical History:  ?Diagnosis Date  ? Asthma   ? Diabetes mellitus without complication (HCC)   ?  ?Past Surgical History:  ?Procedure Laterality Date  ? ANKLE FRACTURE SURGERY    ? ?Family History:  ?Family History  ?Problem Relation Age of Onset  ? Diabetes Father   ? ?Family Psychiatric  History: See previous ?Social History:  ?Social History  ? ?Substance and Sexual Activity  ?Alcohol Use Not Currently  ?   ?Social History  ? ?Substance and Sexual Activity  ?Drug Use Not Currently  ? Frequency: 7.0 times per week  ? Types: Marijuana  ?  ?Social History  ? ?Socioeconomic History  ? Marital status: Single  ?  Spouse name: Not on file  ? Number of children: Not on file  ? Years of education: Not on file  ? Highest education level: Not on file  ?Occupational History  ? Not on file  ?Tobacco Use  ? Smoking status: Never  ?  Passive exposure: Yes  ? Smokeless tobacco: Never  ?Vaping Use  ? Vaping Use: Never used  ?Substance and Sexual Activity  ? Alcohol use: Not Currently  ? Drug use: Not Currently  ?  Frequency: 7.0 times per week  ?  Types: Marijuana  ? Sexual activity: Yes  ?Other Topics Concern  ? Not on file   ?Social History Narrative  ? Not on file  ? ?Social Determinants of Health  ? ?Financial Resource Strain: Not on file  ?Food Insecurity: Not on file  ?Transportation Needs: Not on file  ?Physical Activity: Not on file  ?Stress: Not on file  ?Social Connections: Not on file  ? ? ?Hospital Course: Admitted to the psychiatric unit.  No behavior issues.  No dangerous aggressive or violent behavior.  Consistently denied suicidal ideation.  Patient is tearful when discussing the recent trauma she suffered but with controlled affect and no sign of psychosis.  On review there really is no evidence that she was trying to kill herself and she has consistently denied suicidal thoughts.  Patient is requesting discharge insisting that she will feel better if she can go back to her motel.  We have confirmed that that is available for her.  The only medicine added is a prescription for Seroquel 100 mg at night to assist with sleep and anxiety.  She is to follow-up with [facility] on Friday.  Patient has reviewed plan and does still consistently deny any suicidal ideation. ? ?Physical Findings: ?AIMS:  , ,  ,  ,    ?CIWA:    ?COWS:    ? ?Musculoskeletal: ?Strength & Muscle Tone: within normal limits ?Gait & Station: normal ?Patient leans: N/A ? ? ?Psychiatric Specialty Exam: ? ?Presentation  ?General Appearance: Appropriate  for Environment ? ?Eye Contact:Good ? ?Speech:Clear and Coherent ? ?Speech Volume:Normal ? ?Handedness:Right ? ? ?Mood and Affect  ?Mood:Depressed; Anxious ? ?Affect:Blunt; Constricted; Depressed; Flat ? ? ?Thought Process  ?Thought Processes:Coherent ? ?Descriptions of Associations:Intact ? ?Orientation:Full (Time, Place and Person) ? ?Thought Content:Logical ? ?History of Schizophrenia/Schizoaffective disorder:No ? ?Duration of Psychotic Symptoms:No data recorded ?Hallucinations:Hallucinations: None ? ?Ideas of Reference:None ? ?Suicidal Thoughts:Suicidal Thoughts: No ? ?Homicidal Thoughts:Homicidal  Thoughts: No ? ? ?Sensorium  ?Memory:Immediate Good; Recent Good; Remote Good ? ?Judgment:Fair ? ?Insight:Fair ? ? ?Executive Functions  ?Concentration:Good ? ?Attention Span:Fair ? ?Recall:Good ? ?Fund of Knowledge:Good ? ?Language:Good ? ? ?Psychomotor Activity  ?Psychomotor Activity:Psychomotor Activity: Normal ? ? ?Assets  ?Assets:Communication Skills; Desire for Improvement; Leisure Time; Physical Health; Resilience; Social Support ? ? ?Sleep  ?Sleep:Sleep: Poor ?Number of Hours of Sleep: 1 ? ? ? ?Physical Exam: ?Physical Exam ?Vitals and nursing note reviewed.  ?Constitutional:   ?   Appearance: Normal appearance.  ?HENT:  ?   Head: Normocephalic and atraumatic.  ?   Mouth/Throat:  ?   Pharynx: Oropharynx is clear.  ?Eyes:  ?   Pupils: Pupils are equal, round, and reactive to light.  ?Cardiovascular:  ?   Rate and Rhythm: Normal rate and regular rhythm.  ?Pulmonary:  ?   Effort: Pulmonary effort is normal.  ?   Breath sounds: Normal breath sounds.  ?Abdominal:  ?   General: Abdomen is flat.  ?   Palpations: Abdomen is soft.  ?Musculoskeletal:     ?   General: Normal range of motion.  ?Skin: ?   General: Skin is warm and dry.  ?Neurological:  ?   General: No focal deficit present.  ?   Mental Status: She is alert. Mental status is at baseline.  ?Psychiatric:     ?   Attention and Perception: Attention normal.     ?   Mood and Affect: Mood is anxious. Affect is tearful.     ?   Speech: Speech normal.     ?   Behavior: Behavior is cooperative.     ?   Thought Content: Thought content normal.     ?   Cognition and Memory: Cognition normal.     ?   Judgment: Judgment is impulsive.  ? ?Review of Systems  ?Constitutional: Negative.   ?HENT: Negative.    ?Eyes: Negative.   ?Respiratory: Negative.    ?Cardiovascular: Negative.   ?Gastrointestinal: Negative.   ?Musculoskeletal: Negative.   ?Skin: Negative.   ?Neurological: Negative.   ?Psychiatric/Behavioral:  Positive for depression and substance abuse. Negative for  hallucinations and suicidal ideas. The patient is nervous/anxious and has insomnia.   ?Blood pressure (!) 139/110, pulse 91, temperature 98.2 ?F (36.8 ?C), resp. rate 18, height 5\' 7"  (1.702 m), weight 125 kg, SpO2 97 %, unknown if currently breastfeeding. Body mass index is 43.16 kg/m?. ? ? ?Social History  ? ?Tobacco Use  ?Smoking Status Never  ? Passive exposure: Yes  ?Smokeless Tobacco Never  ? ?Tobacco Cessation:  N/A, patient does not currently use tobacco products ? ? ?Blood Alcohol level:  ?Lab Results  ?Component Value Date  ? ETH <10 07/29/2021  ? ? ?Metabolic Disorder Labs:  ?No results found for: HGBA1C, MPG ?No results found for: PROLACTIN ?No results found for: CHOL, TRIG, HDL, CHOLHDL, VLDL, LDLCALC ? ?See Psychiatric Specialty Exam and Suicide Risk Assessment completed by Attending Physician prior to discharge. ? ?Discharge destination:  Home ? ?Is patient on multiple  antipsychotic therapies at discharge:  No   ?Has Patient had three or more failed trials of antipsychotic monotherapy by history:  No ? ?Recommended Plan for Multiple Antipsychotic Therapies: ?NA ? ?Discharge Instructions   ? ? Diet - low sodium heart healthy   Complete by: As directed ?  ? Diet - low sodium heart healthy   Complete by: As directed ?  ? Increase activity slowly   Complete by: As directed ?  ? Increase activity slowly   Complete by: As directed ?  ? ?  ? ?Allergies as of 07/30/2021   ?No Known Allergies ?  ? ?  ?Medication List  ?  ? ?STOP taking these medications   ? ?acetaminophen 500 MG tablet ?Commonly known as: TYLENOL ?  ?HYDROcodone-acetaminophen 5-325 MG tablet ?Commonly known as: NORCO/VICODIN ?  ?hydrOXYzine 10 MG tablet ?Commonly known as: ATARAX ?  ?meloxicam 7.5 MG tablet ?Commonly known as: MOBIC ?  ?ondansetron 4 MG disintegrating tablet ?Commonly known as: ZOFRAN-ODT ?  ?predniSONE 10 MG tablet ?Commonly known as: DELTASONE ?  ?Suboxone 8-2 MG Film ?Generic drug: Buprenorphine HCl-Naloxone HCl ?  ? ?   ? ?TAKE these medications   ? ?  Indication  ?albuterol 108 (90 Base) MCG/ACT inhaler ?Commonly known as: VENTOLIN HFA ?Inhale 2 puffs into the lungs every 6 (six) hours as needed for wheezing or shortness of

## 2022-04-28 IMAGING — DX DG KNEE COMPLETE 4+V*L*
4 series · 4 of 4 positions shown · non-contrast
Comparison: None.

CLINICAL DATA: Left knee pain for a month and a half, no known
trauma

EXAM:
LEFT KNEE - COMPLETE 4+ VIEW

[knee ap]
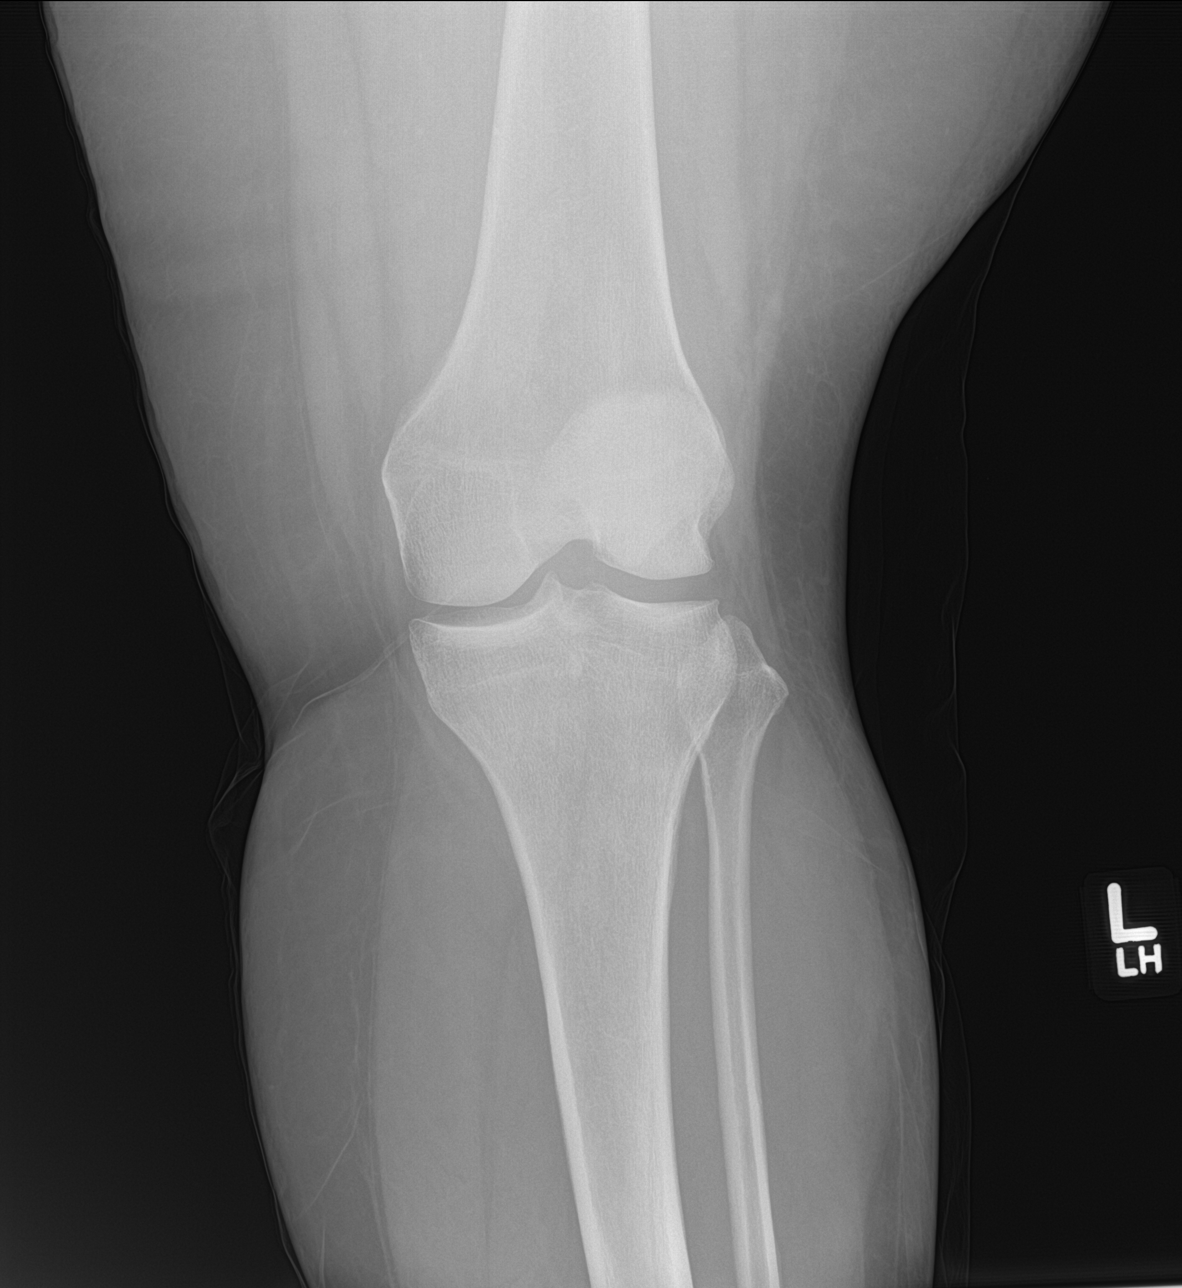

[knee tunnel]
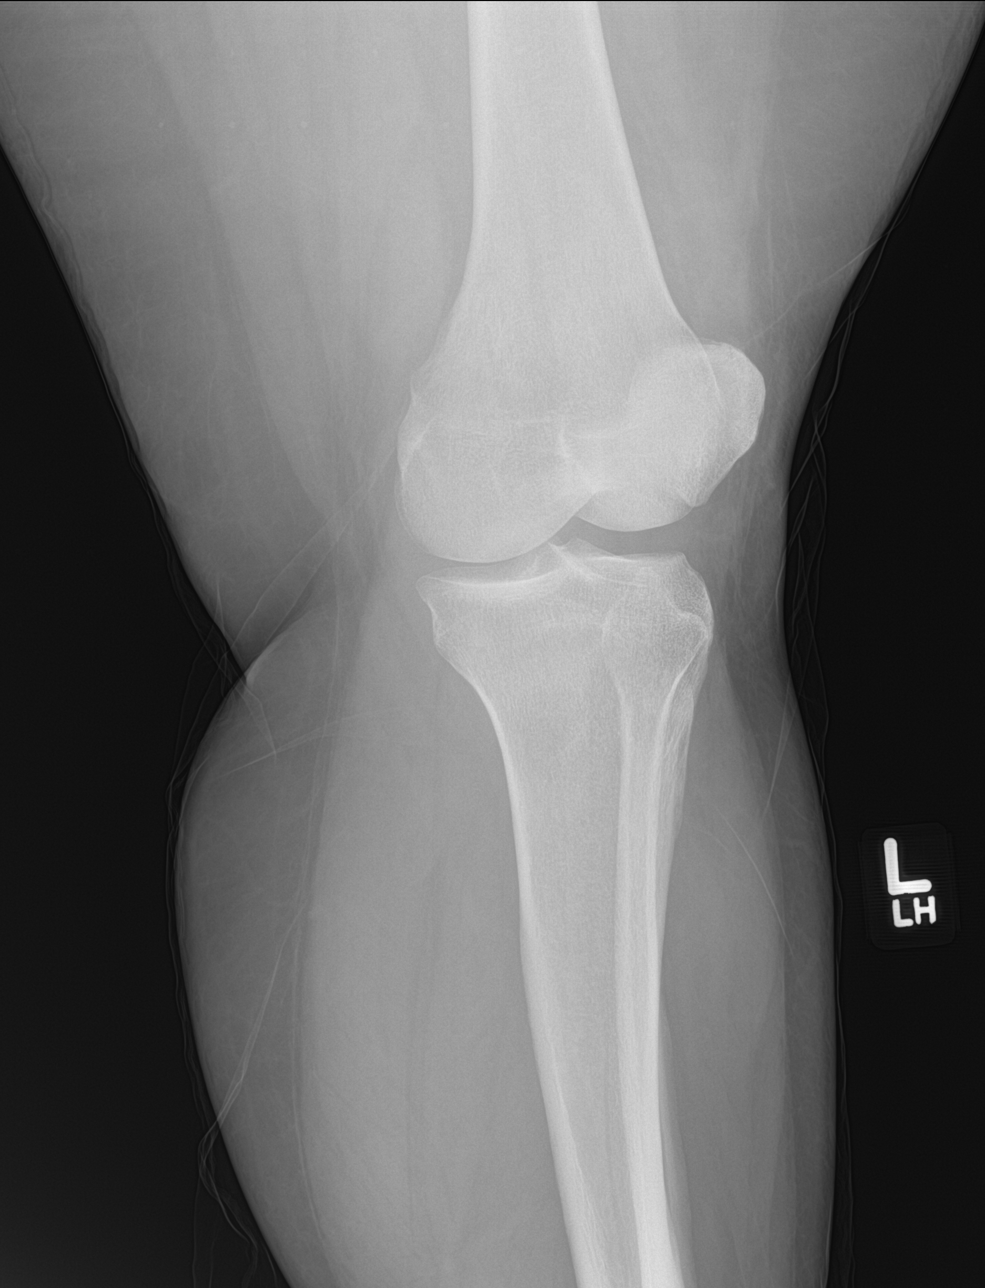

[knee lat]
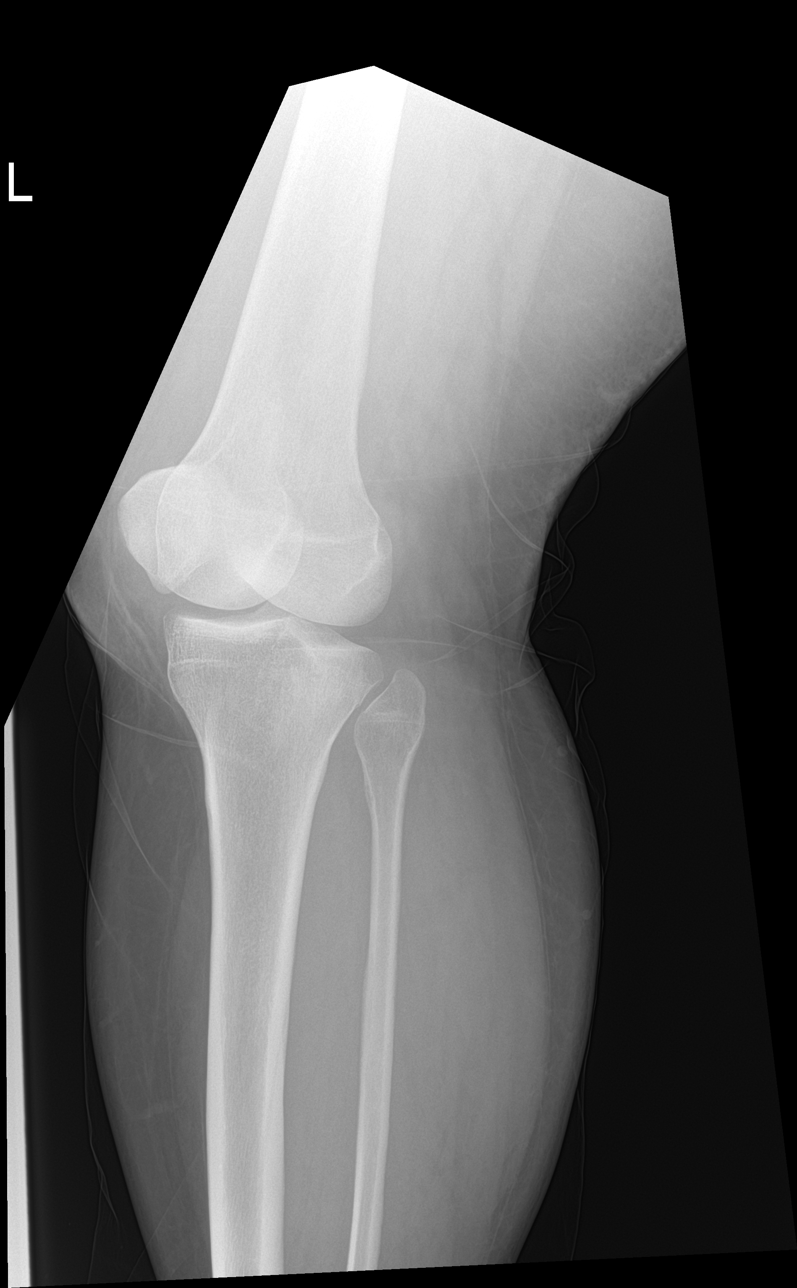

[patella skyline]
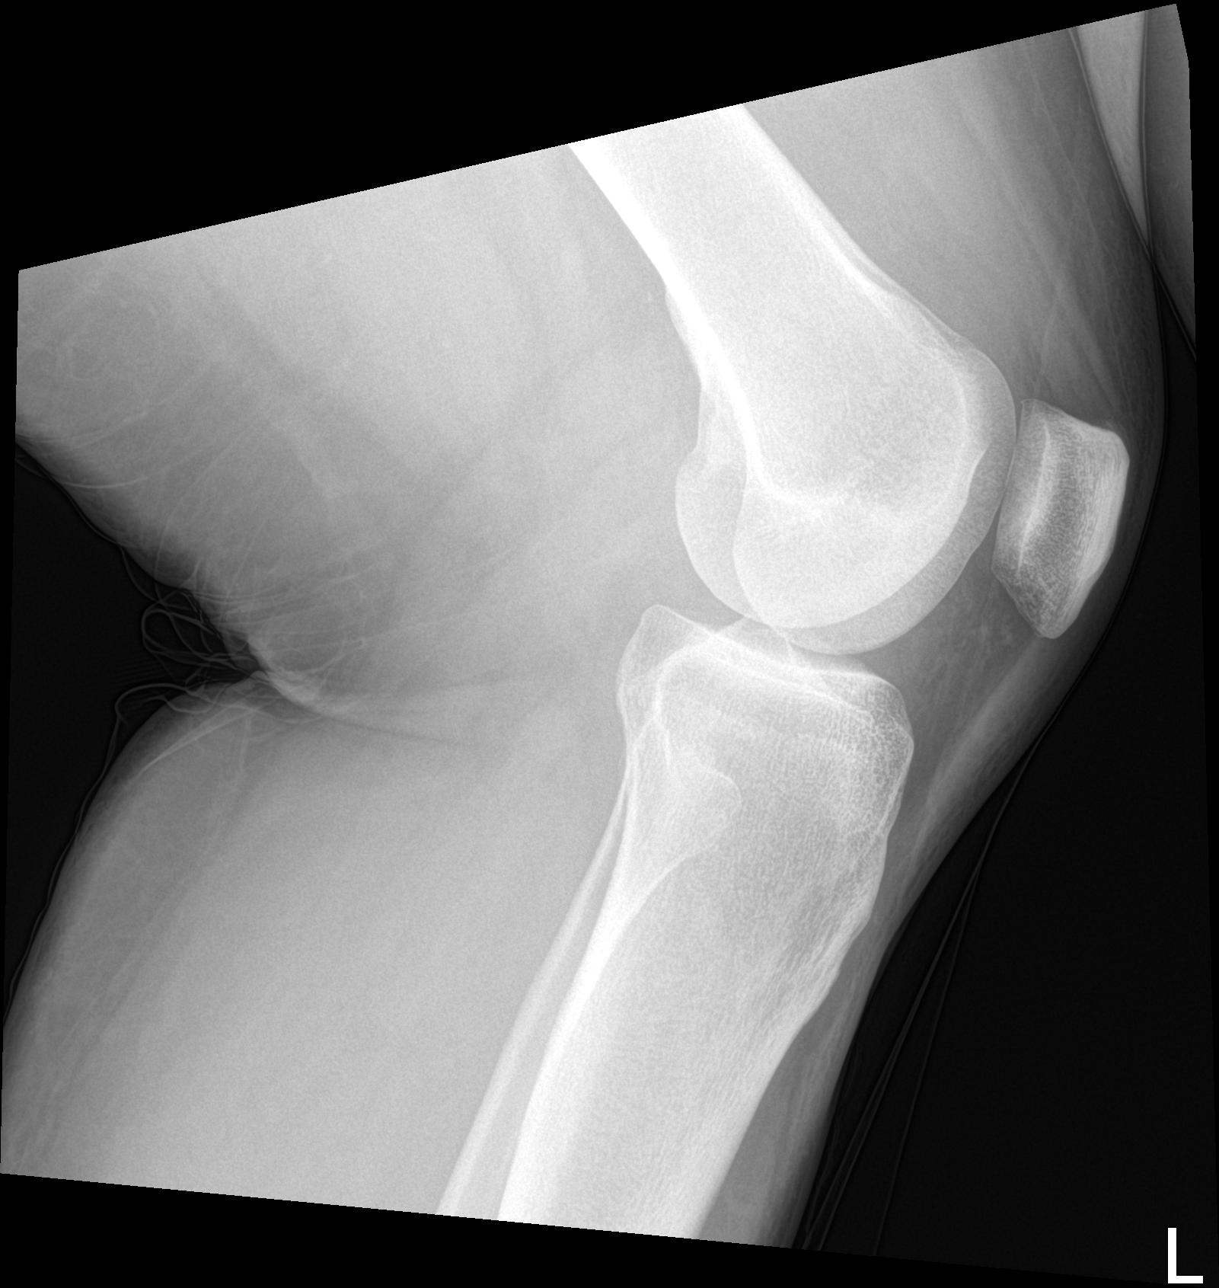

[4 of 4 positions shown; findings below may reference images not displayed]

FINDINGS: No evidence of fracture, dislocation, or joint effusion. No evidence
of arthropathy or other focal bone abnormality. Soft tissues are
unremarkable.
IMPRESSION: No fracture or dislocation of the left knee. Joint spaces are
preserved. No knee joint effusion.
